# Patient Record
Sex: Female | Born: 1935 | Race: White | Hispanic: No | Smoking: Never smoker
Health system: Southern US, Community
[De-identification: ages and names within clinical notes are randomized; demographics above are authoritative.]

## PROBLEM LIST (undated history)

## (undated) DIAGNOSIS — M1990 Unspecified osteoarthritis, unspecified site: Secondary | ICD-10-CM

## (undated) DIAGNOSIS — G609 Hereditary and idiopathic neuropathy, unspecified: Secondary | ICD-10-CM

## (undated) DIAGNOSIS — I639 Cerebral infarction, unspecified: Secondary | ICD-10-CM

## (undated) DIAGNOSIS — R4189 Other symptoms and signs involving cognitive functions and awareness: Secondary | ICD-10-CM

## (undated) DIAGNOSIS — F0390 Unspecified dementia without behavioral disturbance: Secondary | ICD-10-CM

## (undated) DIAGNOSIS — Z5189 Encounter for other specified aftercare: Secondary | ICD-10-CM

## (undated) DIAGNOSIS — K648 Other hemorrhoids: Secondary | ICD-10-CM

## (undated) DIAGNOSIS — H9110 Presbycusis, unspecified ear: Secondary | ICD-10-CM

## (undated) DIAGNOSIS — I619 Nontraumatic intracerebral hemorrhage, unspecified: Secondary | ICD-10-CM

## (undated) DIAGNOSIS — F32A Depression, unspecified: Secondary | ICD-10-CM

## (undated) DIAGNOSIS — I82409 Acute embolism and thrombosis of unspecified deep veins of unspecified lower extremity: Secondary | ICD-10-CM

## (undated) DIAGNOSIS — Z95828 Presence of other vascular implants and grafts: Secondary | ICD-10-CM

## (undated) DIAGNOSIS — G44009 Cluster headache syndrome, unspecified, not intractable: Secondary | ICD-10-CM

## (undated) DIAGNOSIS — G8929 Other chronic pain: Secondary | ICD-10-CM

## (undated) DIAGNOSIS — E785 Hyperlipidemia, unspecified: Secondary | ICD-10-CM

## (undated) DIAGNOSIS — G629 Polyneuropathy, unspecified: Secondary | ICD-10-CM

## (undated) DIAGNOSIS — I10 Essential (primary) hypertension: Secondary | ICD-10-CM

## (undated) DIAGNOSIS — R51 Headache: Secondary | ICD-10-CM

## (undated) DIAGNOSIS — T7840XA Allergy, unspecified, initial encounter: Secondary | ICD-10-CM

## (undated) DIAGNOSIS — F329 Major depressive disorder, single episode, unspecified: Secondary | ICD-10-CM

## (undated) HISTORY — DX: Essential (primary) hypertension: I10

## (undated) HISTORY — DX: Depression, unspecified: F32.A

## (undated) HISTORY — DX: Encounter for other specified aftercare: Z51.89

## (undated) HISTORY — DX: Presbycusis, unspecified ear: H91.10

## (undated) HISTORY — DX: Hyperlipidemia, unspecified: E78.5

## (undated) HISTORY — DX: Allergy, unspecified, initial encounter: T78.40XA

## (undated) HISTORY — DX: Polyneuropathy, unspecified: G62.9

## (undated) HISTORY — DX: Unspecified osteoarthritis, unspecified site: M19.90

## (undated) HISTORY — DX: Major depressive disorder, single episode, unspecified: F32.9

## (undated) HISTORY — PX: TONSILLECTOMY AND ADENOIDECTOMY: SUR1326

---

## 2006-10-13 DIAGNOSIS — Z95828 Presence of other vascular implants and grafts: Secondary | ICD-10-CM

## 2006-10-13 DIAGNOSIS — I82409 Acute embolism and thrombosis of unspecified deep veins of unspecified lower extremity: Secondary | ICD-10-CM

## 2006-10-13 HISTORY — PX: VENA CAVA FILTER PLACEMENT: SUR1032

## 2006-10-13 HISTORY — DX: Acute embolism and thrombosis of unspecified deep veins of unspecified lower extremity: I82.409

## 2006-10-13 HISTORY — DX: Presence of other vascular implants and grafts: Z95.828

## 2007-08-30 ENCOUNTER — Encounter (INDEPENDENT_AMBULATORY_CARE_PROVIDER_SITE_OTHER): Payer: Self-pay | Admitting: Geriatric Medicine

## 2007-08-30 ENCOUNTER — Observation Stay (HOSPITAL_COMMUNITY): Admission: AD | Admit: 2007-08-30 | Discharge: 2007-09-01 | Payer: Self-pay | Admitting: Internal Medicine

## 2007-08-30 ENCOUNTER — Ambulatory Visit: Payer: Self-pay | Admitting: *Deleted

## 2007-09-13 DIAGNOSIS — I619 Nontraumatic intracerebral hemorrhage, unspecified: Secondary | ICD-10-CM

## 2007-09-13 HISTORY — DX: Other symptoms and signs involving cognitive functions and awareness: I61.9

## 2007-10-03 ENCOUNTER — Inpatient Hospital Stay (HOSPITAL_COMMUNITY): Admission: EM | Admit: 2007-10-03 | Discharge: 2007-10-21 | Payer: Self-pay | Admitting: Emergency Medicine

## 2007-10-27 ENCOUNTER — Encounter: Admission: RE | Admit: 2007-10-27 | Discharge: 2007-10-27 | Payer: Self-pay | Admitting: Geriatric Medicine

## 2007-11-10 ENCOUNTER — Encounter: Admission: RE | Admit: 2007-11-10 | Discharge: 2007-11-10 | Payer: Self-pay | Admitting: Geriatric Medicine

## 2007-11-17 ENCOUNTER — Ambulatory Visit (HOSPITAL_COMMUNITY): Admission: RE | Admit: 2007-11-17 | Discharge: 2007-11-17 | Payer: Self-pay | Admitting: Geriatric Medicine

## 2007-11-22 ENCOUNTER — Encounter: Admission: RE | Admit: 2007-11-22 | Discharge: 2007-11-22 | Payer: Self-pay | Admitting: Geriatric Medicine

## 2008-01-19 ENCOUNTER — Ambulatory Visit (HOSPITAL_COMMUNITY): Admission: RE | Admit: 2008-01-19 | Discharge: 2008-01-19 | Payer: Self-pay | Admitting: Geriatric Medicine

## 2008-03-08 ENCOUNTER — Ambulatory Visit (HOSPITAL_COMMUNITY): Admission: RE | Admit: 2008-03-08 | Discharge: 2008-03-08 | Payer: Self-pay | Admitting: Geriatric Medicine

## 2008-03-08 ENCOUNTER — Ambulatory Visit: Admission: RE | Admit: 2008-03-08 | Discharge: 2008-03-08 | Payer: Self-pay | Admitting: Geriatric Medicine

## 2008-10-23 IMAGING — CR DG ABDOMEN 1V
2 series · 2 of 2 positions shown · non-contrast
Comparison: 10/10/2007

CLINICAL DATA: Abdominal bloating

[t abdomen supine (1 of 2)]
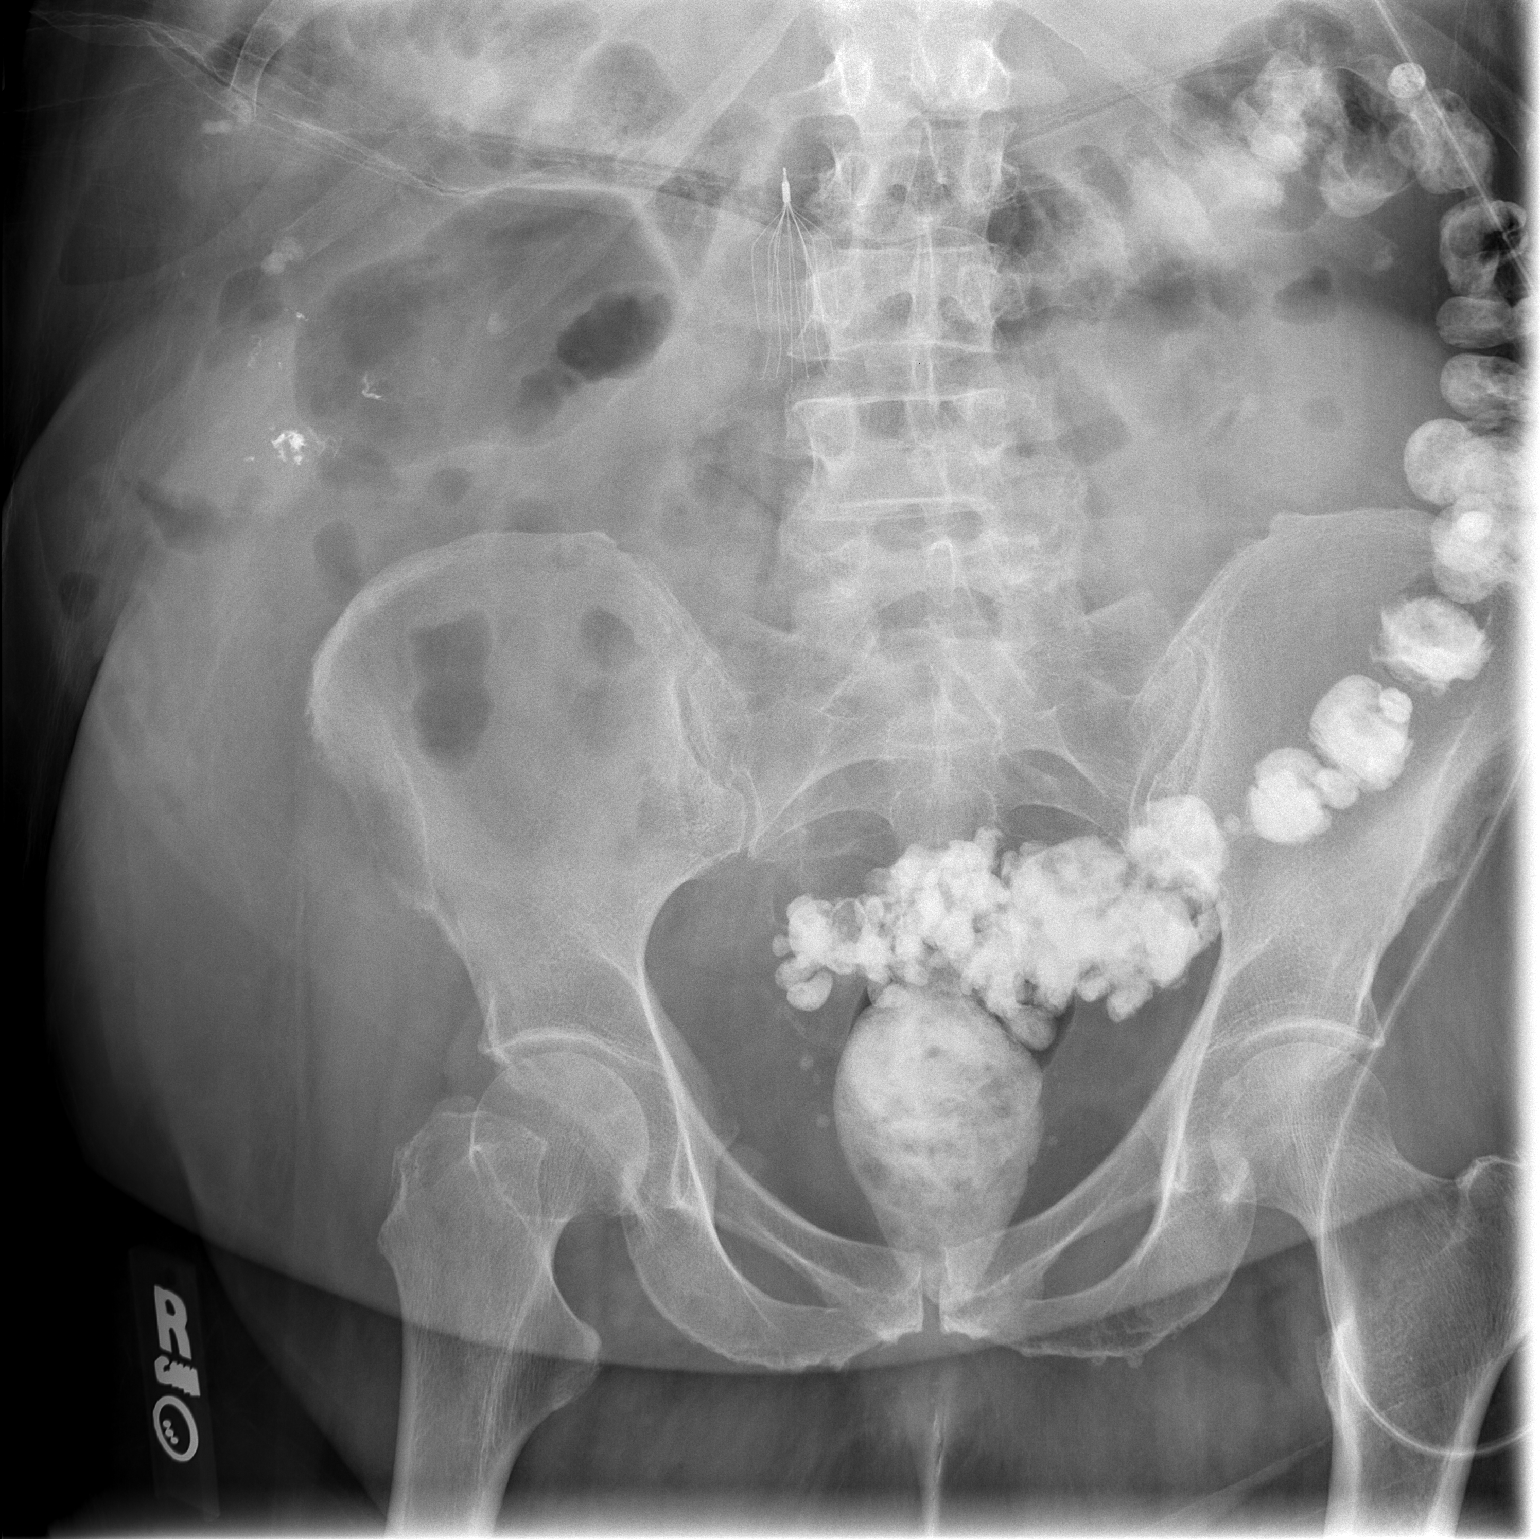

[t abdomen supine (2 of 2)]
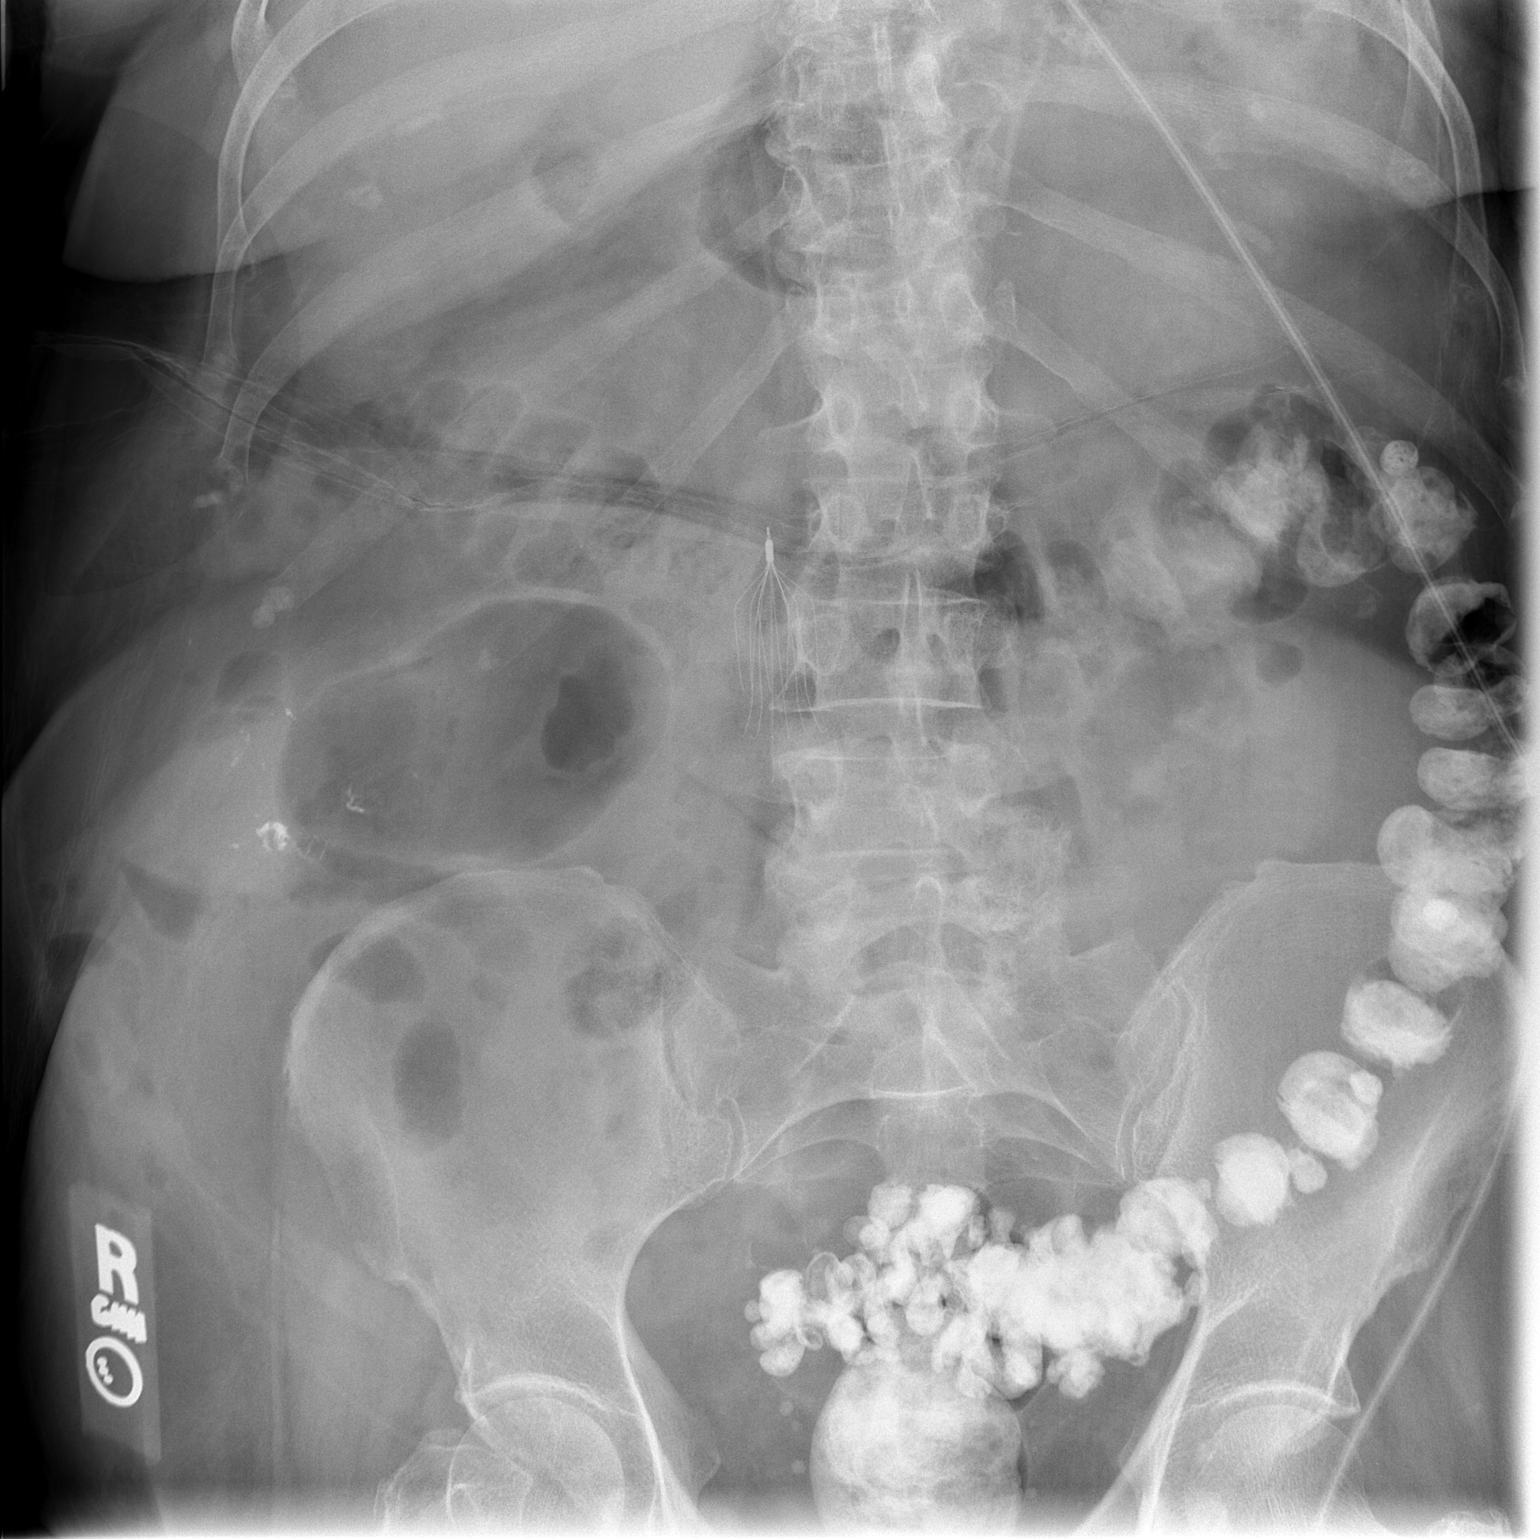

[2 of 2 positions shown; findings below may reference images not displayed]

ABDOMEN - 1 VIEW:

One view supine abdomen shows no gaseous bowel dilation to suggest obstruction.
Bowel loop in the right upper quadrant is probably related to the colon,
possibly representing a high cecum. There is barium in the left colon and rectum
in this patient with a swallowing function study performed 3 days ago. IVC
filter is noted. Visualized bony structures are unremarkable.
IMPRESSION: No evidence for bowel obstruction or ileus.

## 2008-10-25 IMAGING — CR DG CHEST 2V
1 series · 1 of 1 positions shown · non-contrast
Comparison: Portable study 10/14/07.

CLINICAL DATA: Subarachnoid hemorrhage.  Dyspnea and cough. 
 CHEST ? 2 VIEW:

[w chest lat]
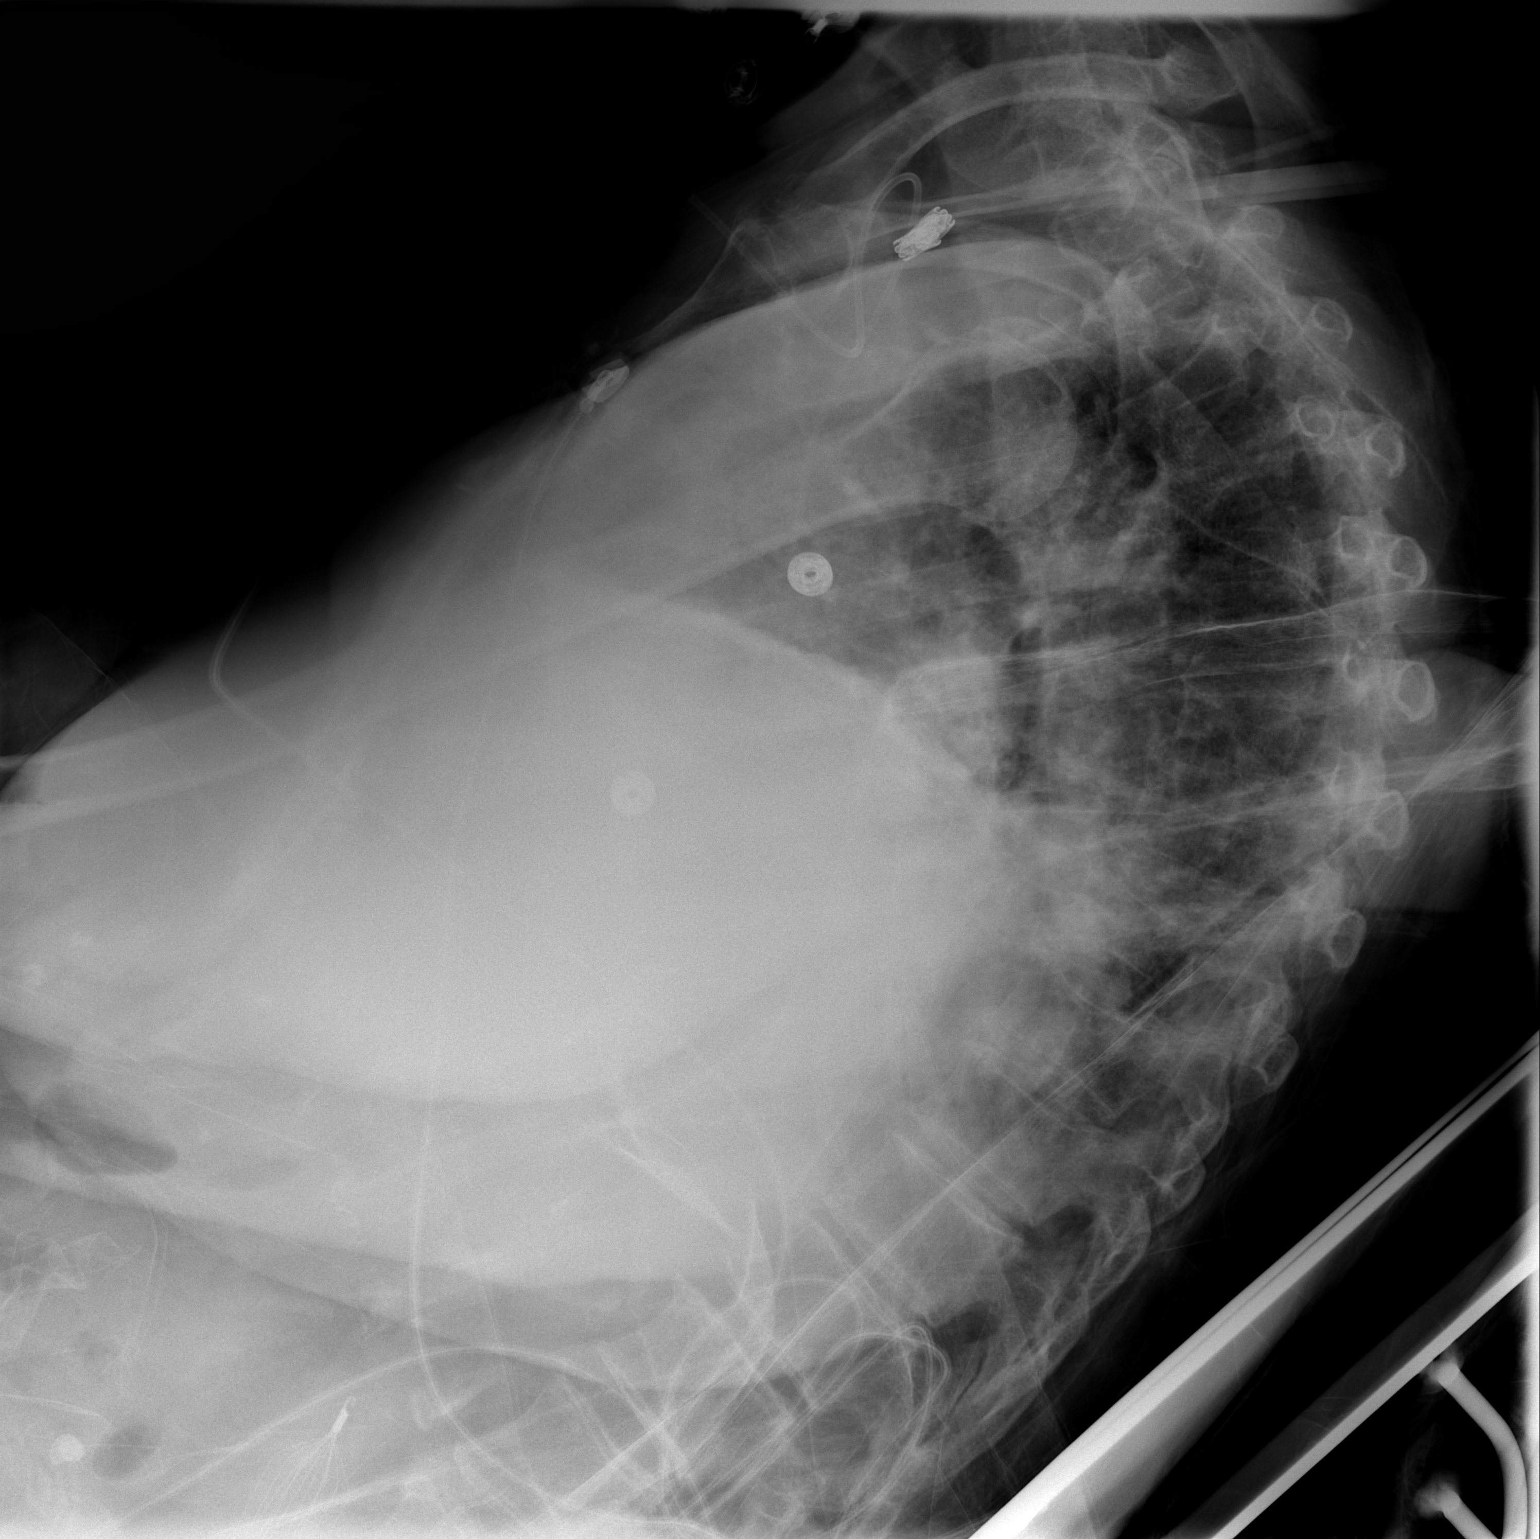

[1 of 1 positions shown; findings below may reference images not displayed]

FINDINGS: Right arm PICC is unchanged in the SVC.  The heart size and mediastinal contours are stable.  Overall pulmonary aeration has improved with mild residual bibasilar airspace disease, probably reflecting atelectasis.  There may be a small amount of pleural fluid bilaterally.  There is no evidence of pneumothorax or edema.
IMPRESSION: 1.  Stable position of right arm PICC.
 2.  Interval improvement in bibasilar aeration.

## 2008-10-26 IMAGING — CT CT HEAD W/O CM
1 series · 15 of 30 positions shown, 19 images · IV contrast (agent unspecified)
Comparison: 10/10/07.

CLINICAL DATA: Subarachnoid hemorrhage.  
HEAD CT WITHOUT CONTRAST:
TECHNIQUE: Contiguous axial images were obtained from the base of the skull through the vertex according to standard protocol without contrast.

[Series 2: brain · axial · 0.47mm/px · z∈[+175,+308]mm · 15 of 40 slices shown, 19 images]
[im 2/40  brain]
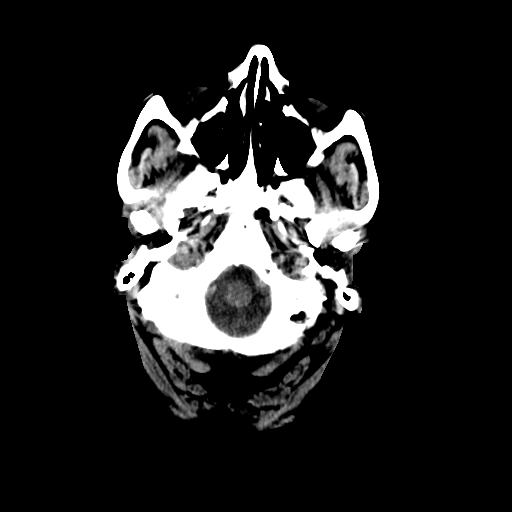
[im 2/40  bone]
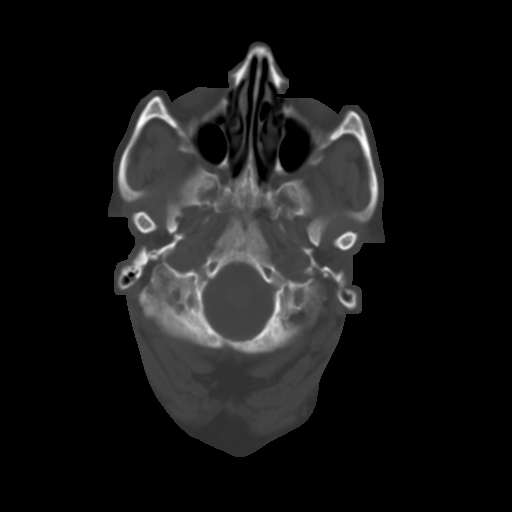
[im 5/40  brain]
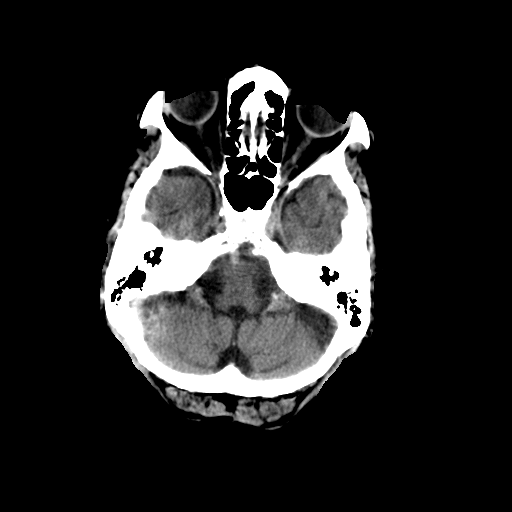
[im 7/40  brain]
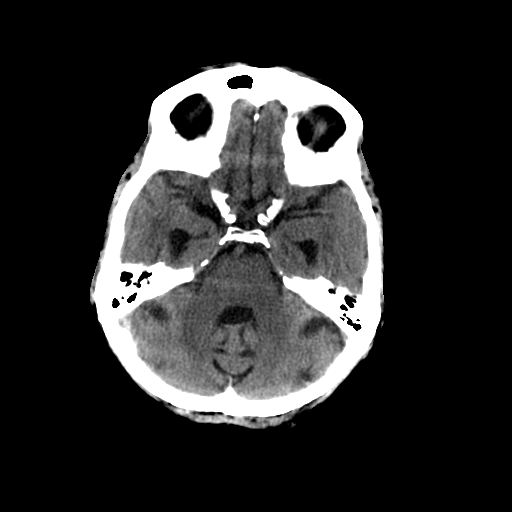
[im 10/40  brain]
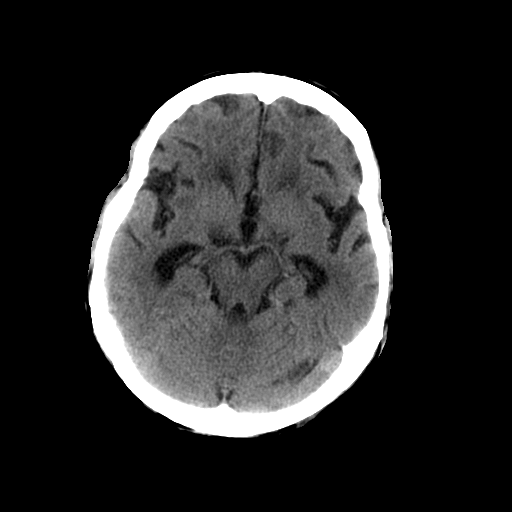
[im 13/40  brain]
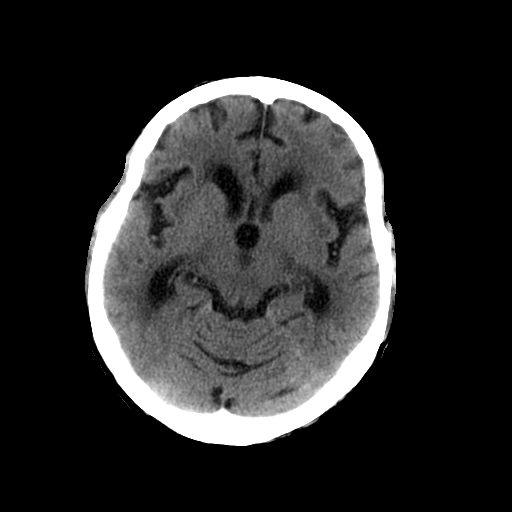
[im 13/40  bone]
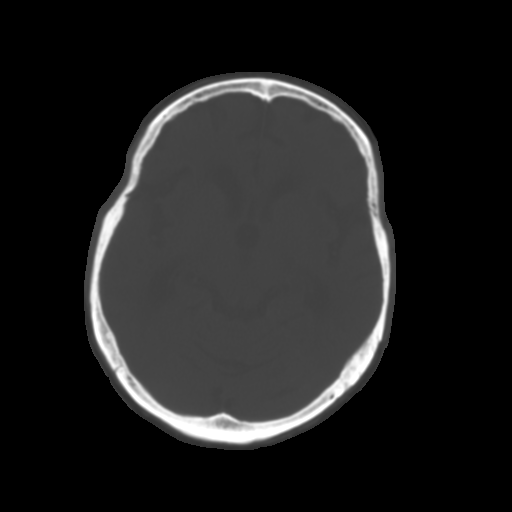
[im 15/40  brain]
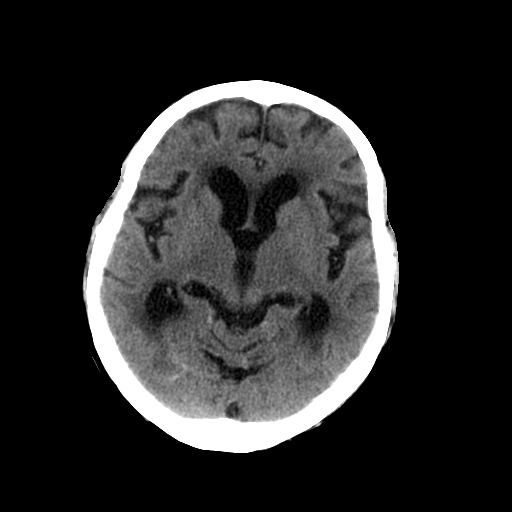
[im 18/40  brain]
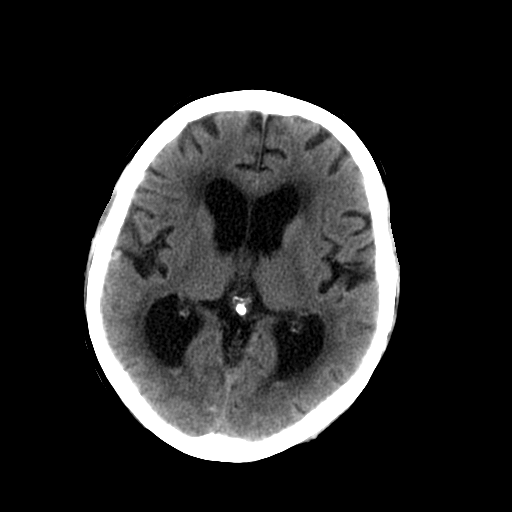
[im 21/40  brain]
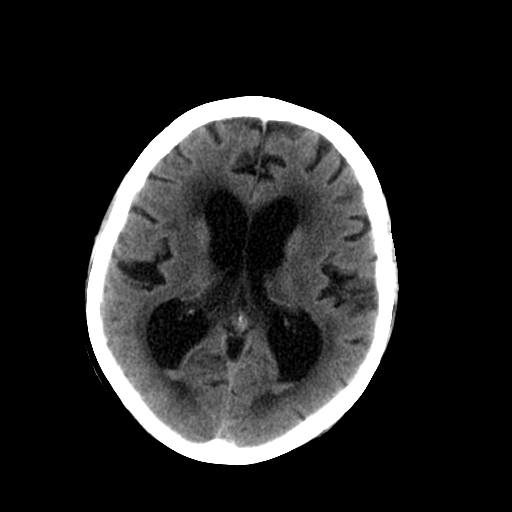
[im 22/40  brain]
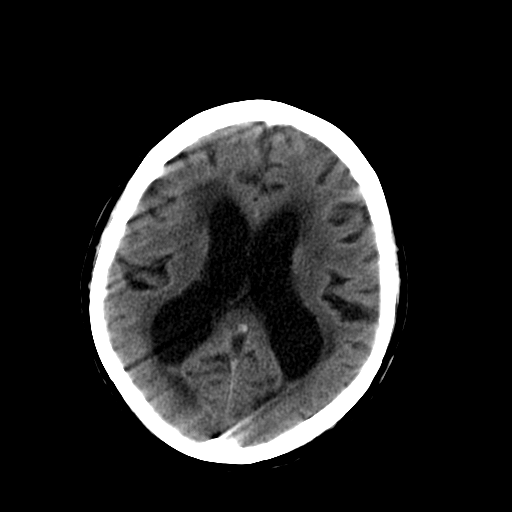
[im 22/40  bone]
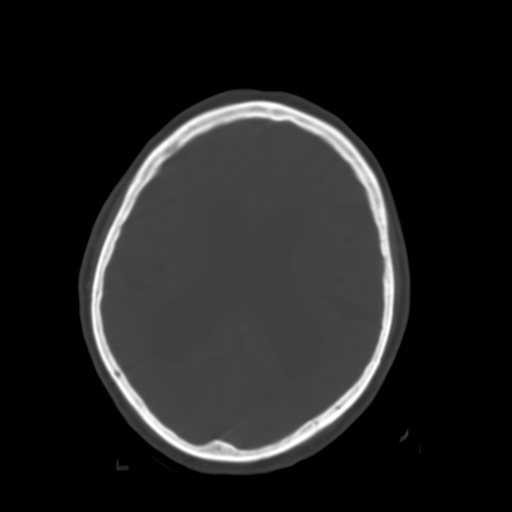
[im 25/40  brain]
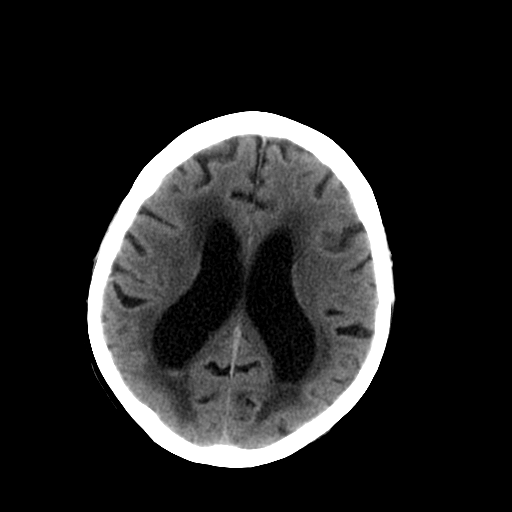
[im 27/40  brain]
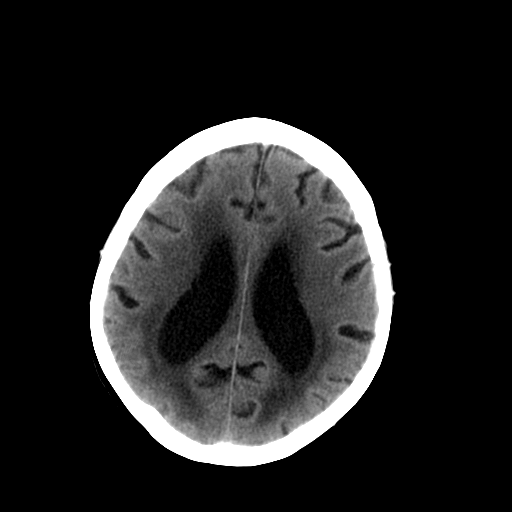
[im 30/40  brain]
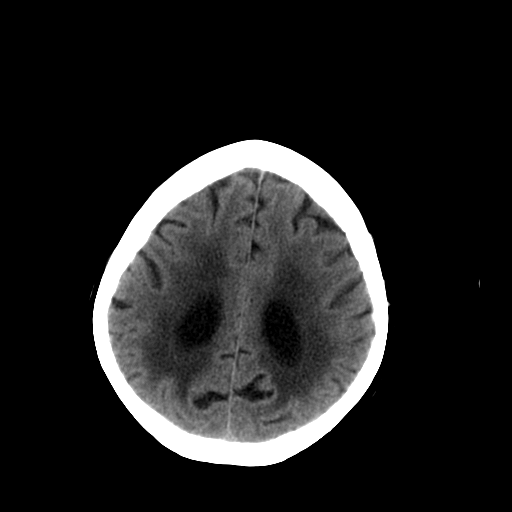
[im 33/40  brain]
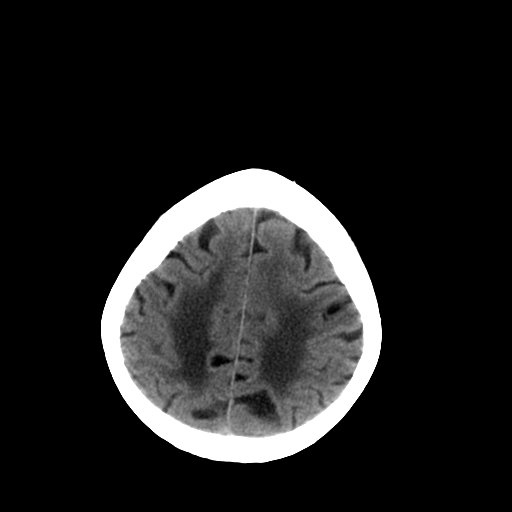
[im 33/40  bone]
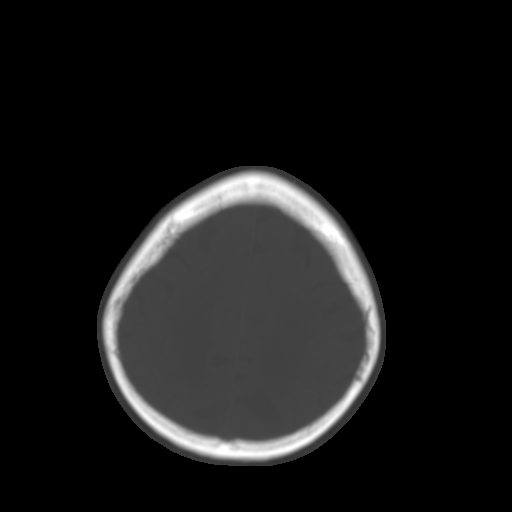
[im 35/40  brain]
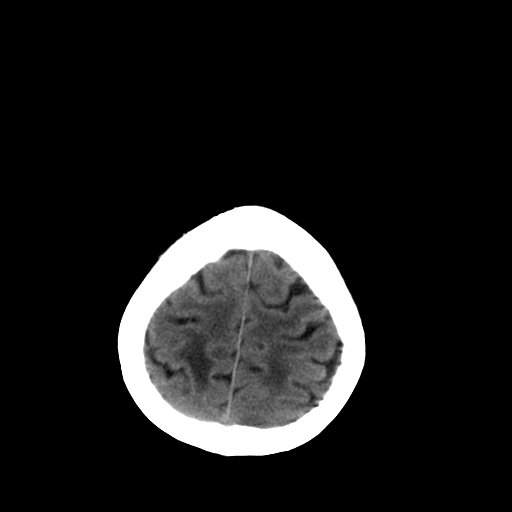
[im 38/40  brain]
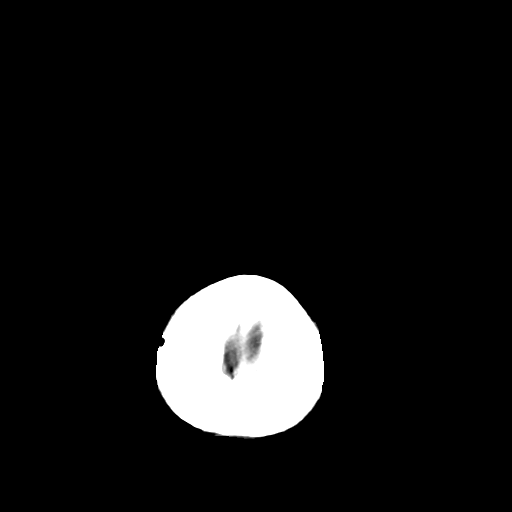

[15 of 30 positions shown; findings below may reference images not displayed]

Findings;   There is motion on the CT scan and some of the images were repeated. 
There is resolving intraventricular hemorrhage with only a small amount of residual intraventricular hemorrhage noted on today?s study.  There is a small amount of residual subarachnoid hemorrhage in the right temporal lobes and in the right occipital lobe.  Hemorrhage anterior to the pons and medulla has resolved.  No new hemorrhage is present.  There is no subdural hematoma.  
The ventricles remain enlarged, but stable.  This is likely due to prominent atrophy as there is commensurate enlargement of the subarachnoid space.  There could be a mild element of communicating hydrocephalus.  There is no midline shift.  There is extensive chronic ischemia in the white matter which is unchanged.
IMPRESSION: 1.  Resolving intraventricular and subarachnoid hemorrhage.  No new hemorrhage is identified.  
2.  Ventricular prominence is stable from the prior study.  There is diffuse chronic microvascular change in the white matter.

## 2010-11-03 ENCOUNTER — Encounter: Payer: Self-pay | Admitting: Geriatric Medicine

## 2011-02-25 NOTE — H&P (Signed)
NAMEAVENLY, ROBERGE                ACCOUNT NO.:  [id number]   MEDICAL RECORD NO.:  [id number]          PATIENT TYPE:  INP   LOCATION:  5729                         FACILITY:  [facility]   PHYSICIAN:  Michiel Cowboy, MDDATE OF BIRTH:  August 02, 1921   DATE OF ADMISSION:  08/30/2007  DATE OF DISCHARGE:                              HISTORY & PHYSICAL   ATTENDING:  Michiel Cowboy, MD   PRIMARY CARE Jaidy Cottam:  Dr. Pete Glatter.   CHIEF COMPLAINT:  Left lower extremity swelling.   HISTORY OF PRESENT ILLNESS:  This is an 77 year old female with history  of peripheral venous insufficiency and peripheral neuropathy who  developed left lower extremity edema for the past 2 days.  Dr. Pete Glatter  had sent her down to get ultrasound of her lower extremity.  Ultrasound  showed left DVT extending to femoral vein.  The patient was called in to  [facility] for admission.   The patient otherwise feels fairly well.  She reports she lives a fairly  sedentary lifestyle and does not get up much off the couch.  She has had  a colonoscopy in the past 10 years that showed polyps but cannot  remember exactly when that was done.   FAMILY HISTORY:  Noncontributory.   SOCIAL HISTORY:  No tobacco, no alcohol. Son is at bedside.  The patient  currently retired.   PAST MEDICAL HISTORY:  Significant for urinary incontinence and  peripheral neuropathy, unclear etiology.   ALLERGIES:  NO KNOWN DRUG ALLERGIES.   MEDICATIONS:  Lyrica, patient unsure of dose 3 times a day,  multivitamin, vitamin C, ICaps, calcium, vitamin D.   PHYSICAL EXAMINATION:  VITAL SIGNS:  Blood pressure 142/70, respirations  18, pulse 59, oxygen saturation 91% on room air.  Temperature 98.0.  GENERAL:  The patient in no acute distress, obese female.  HEENT:  Nontraumatic.  Moist mucous membranes.  HEART:  Regular rate and rhythm.  No murmurs, rubs or gallops.  LUNGS:  Clear to auscultation bilaterally.  ABDOMEN:  Obese, but  nontender.  Nondistended.  Extremities: Lower extremities about 1+ edema on the left and no edema  on the right.  Lower extremity left, definitely appears very swollen and  enlarge in diameter.   STUDIES:  Doppler of the lower extremities showed DVT on the left side  extending to femoral vein.   ASSESSMENT/PLAN:  This is an 77 year old female who is now new deep vein  thrombosis.  1. Deep vein thrombosis.  We will obtain chemistry, CBC and initial      coags.  We will start the patient on Lovenox as per pharmacy.  We      will start the patient on Coumadin tomorrow.  Given the patient is      slightly hypoxic on room air, we will obtain CT of chest once      results of chemistry panel is back and we know what her creatinine      is.  We will do Lovenox teaching.  2. Urinary incontinence.  We will ask the patient to bring in her home  medications because she is unsure what she is taking for this, if      anything.  Concerning hypercoagulable workup we will defer this to      primary care physician.  I discussed with Dr. Brooke Dare.  3. Peripheral neuropathy.  We will obtain hemoglobin A1c and TSH in      the morning.  4. Prophylaxis.  The patient will be on Lovenox.  Will do Protonix.   DISPOSITION:  Anticipate discharging in the next few days if the patient  is stable.  PT, OT to assess for the patient's need.      Michiel Cowboy, MD  Electronically Signed     AVD/MEDQ  D:  08/30/2007  T:  08/30/2007  Job:  [id number]   cc:   Hal T. Stoneking, M.D.

## 2011-02-25 NOTE — Discharge Summary (Signed)
Alice Wu, Alice Wu                ACCOUNT NO.:  [id number]   MEDICAL RECORD NO.:  [id number]          PATIENT TYPE:  OBV   LOCATION:  4733                         FACILITY:  [facility]   PHYSICIAN:  Michiel Cowboy, MDDATE OF BIRTH:  10/04/1921   DATE OF ADMISSION:  08/30/2007  DATE OF DISCHARGE:  09/01/2007                               DISCHARGE SUMMARY   Ms. [name] is an 77 year old female with a history of peripheral  neuropathy, urinary incontinence, and peripheral venous insufficiency  who presented with lower extremity edema for 2 days.  She came to see  her primary care Shyane Fossum who obtained an ultrasound and it showed lower  extremity DVT.   HOSPITAL COURSE:  The patient admitted by [facility], started on  Lovenox and Coumadin for DVT treatment, was discharged home on Lovenox  shots and Coumadin, and to have careful follow up with Dr. Pete Glatter for  INR.   DISCHARGE DIAGNOSES:  1. Deep venous thrombosis.  2. Chronic peripheral neuropathy.  3. Chronic urinary incontinence.   The patient was discharged on September 01, 2007.   The patient received education on how to administer shots.  The patient  was discharged back to [place] with home heath help for helping with  administration of Lovenox.  The patient to follow up with her primary  care Kynesha Guerin, Dr. Pete Glatter.  The patient to have her INR checked on  the day of the discharge.   DISCHARGE MEDICATIONS:  1. She is to also continue Lovenox 90 mg subcu q.12 h for 5 days.  2. Coumadin 5 mg p.o. daily.  3. Lyrica 100 mg 3 times a day.  4. Multivitamins.  5. ICaps daily.  6. Calcium plus vitamin D daily.  7. TriCor 145 mg p.o. daily.  8. Potassium 10 mEq p.o. b.i.d.  9. VESIcare 5 mg p.o. daily.      Michiel Cowboy, MD  Electronically Signed    AVD/MEDQ  D:  01/25/2008  T:  01/25/2008  Job:  [id number]   cc:   Hal T. Stoneking, M.D.

## 2011-02-25 NOTE — Discharge Summary (Signed)
NAMEGLENIS, MUSOLF                ACCOUNT NO.:  [id number]   MEDICAL RECORD NO.:  [id number]          PATIENT TYPE:  INP   LOCATION:  3004                         FACILITY:  [facility]   PHYSICIAN:  Michiel Cowboy, MDDATE OF BIRTH:  1921/01/01   DATE OF ADMISSION:  10/03/2007  DATE OF DISCHARGE:                               DISCHARGE SUMMARY   INTERIM DISCHARGE SUMMARY:   DATE OF DISCHARGE:  To be determined.   STUDIES:  1. CT of the head done on October 03, 2007 showing subdural and      epidural blood tracking down from posterior fossa all the way down      to C4-5 disk space level, now compromising mild mass effect on the      cervical cord.  No acute bony findings.  High-density change in      cervical spondylosis.  Also subarachnoid, interventricular, and      subdural blood. likely spontaneous.  No evidence of aneurysm.      Ventricular enlargement secondary to interventricular blood.      Periventricular white matter disease.  No significant bony      findings.  2. MRI of the brain done on October 04, 2007 showing subdural      hematoma on the clivus with extension along the left temporal bone.      Maximum thickness of 16 mm along clivus, exerting some mass effect      upon the pons.  Subarachnoid blood particularly along the convexity      of the right plus/minus contusion to the right temporal lobe.      Interventricular blood.  No acute stroke.  3. X-ray of the abdomen showing limited motion portable exam and      nonspecific bowel gas pattern.  Gas-filled non-dilated loops of      bowel noted centrally.  4. KUB from October 04, 2007 status post IVC filter.  5. Another KUB done on October 10, 2007 showing no evidence of      obstruction,  6. Chest x-ray done on October 14, 2007 showing right-sided PICC line      tip.  No pneumothorax identified. elevated right hemidiaphragm.  7. Chest x-ray from October 11, 2007 showing right middle lobe and      right upper  lobe opacification which may represent infiltrate or      atelectasis, mild subsegmental changes, left base.  Cardiomegaly.  8. Repeat CT scan of the head without contrast on October 10, 2007      showing stable examination.  No change in appearance.  9. Swallowing study done on October 13, 2007 showing some dysphagia.  10.Abdominal KUB done on October 16, 2007 showing no evidence of bowel      obstruction or ileus.  11.Echocardiogram done on October 12, 2007 showing EF of about 60%      but poor study.  Mild-to-moderate mitral annular calcifications.   CONSULTATIONS:  1. Rosine Abe, M.D. of [facility].  2. Neurosurgery.   HOSPITAL COURSE:  The patient is a very pleasant 77 year old female with  past medical  history significant for DVT diagnosed in November of 2008  as well as peripheral neuropathy and diabetes.  The patient presented to  the emergency department on October 03, 2007 with severe headache and  supratherapeutic INR.  CT scan of her head showed extensive subarachnoid  hemorrhage extending down to her spinal canal.  Neurosurgery was  consulted and felt that she is not an operative candidate.  Her  anticoagulation was stopped, and she underwent IVC filter implant on  October 04, 2007.  Her hospital course was complicated by an episode of  rapid atrial fibrillation requiring a transient Cardizem drip for which  a cardiology consult was called.  She was also loaded on digoxin.  Since  then, the patient converted back to sinus.  During an episode of atrial  fibrillation with rapid ventricular response, she had a mild troponin  elevation up to 0.19 which was felt to be secondary to rapid atrial  fibrillation.  The patient remained hemodynamically stable.  Secondary  to her neurological status and confusion, family requested placement in  a nursing facility.   Concerning her other problems, the patient had a questionable pneumonia  thought to be possibly  secondary to aspiration for which she was started  on Zosyn.  A PICC line was placed on October 14, 2007.  Patient expected  to complete about 7 to 10-day course of antibiotics.  The patient was  also noted to have urinary tract infection on October 11, 2007 which  grew Klebsiella which is of note sensitive to ciprofloxacin and  levofloxacin but resistant to ampicillin.   Regarding nutritional status, the patient, secondary to confusion, has  been having difficulties with her nutrition but this may gradually  improve as her mental status has been improving.   At the time of this dictation, her current medications are:  1. Diltiazem 240 mg p.o. daily.  2. Zosyn 0.375 grams IV q.6h.  3. Labetalol 10 mg IV q.4h. p.r.n.  4. Senna 1 tablet p.o. q.h.s.  5. Tramadol 50 mg p.o. q.6h.   DIET:  The patient is on a dysphagia 3 diet.   ACTIVITY:  Followed by physical therapy and occupational therapy.   Please see final addendum for discharge medications and events after  this dictation.      Michiel Cowboy, MD  Electronically Signed     AVD/MEDQ  D:  10/17/2007  T:  10/17/2007  Job:  [id number]   cc:   Hal T. Stoneking, M.D.  Elmore Guise., M.D.

## 2011-02-25 NOTE — Discharge Summary (Signed)
NAMELORISA, Wu                ACCOUNT NO.:  [id number]   MEDICAL RECORD NO.:  [id number]          PATIENT TYPE:  INP   LOCATION:  3004                         FACILITY:  [facility]   PHYSICIAN:  Corinna L. Lendell Caprice, MDDATE OF BIRTH:  03/16/1921   DATE OF ADMISSION:  10/03/2007  DATE OF DISCHARGE:  10/21/2007                               DISCHARGE SUMMARY   ADDENDUM  This is an addendum to the previously dictated discharge summary by Dr.  Adela Glimpse on October 17, 2007.   Since October 17, 2007 patient had a repeat CT of the brain on October 19, 2007 which showed resolving intraventricular and subarachnoid  hemorrhage.  No new hemorrhage identified.  Diffuse chronic  microvascular change.  Two views of the chest done on October 18, 2007  showed interval improvement in basilar aeration.  She had a repeat UA  which showed negative nitrite, negative leukocyte esterase, negative  blood and urine culture is negative.  Her electrolytes and CBC remained  stable.  Her prealbumin was 21.  She had a calorie count, and was felt  by Dietary to be taking in adequate nutrition.  They have recommended  Ensure pudding t.i.d.   Please note that she is to be on dysphagia 3 diet with honey thickened  liquids, not thin liquids as previously dictated.  Her Zosyn and has  been stopped, and she has been switched to Augmentin.  Her vital signs  have been stable.  Her mental status has improved.  She is alert and  conversant.  She still has difficulty with the name of the hospital, but  does know that she is in the hospital and knows the year.  She remains  weak, and will need physical therapy and occupational therapy.  She will  also need a repeat modified barium swallow in a weeks time to see  whether she may advance her diet.   FINAL MEDICATION RECOMMENDATIONS.:  Stop the Zosyn.  Augmentin 875 mg  p.o. b.i.d. until October 23, 2007 and then stop Tramadol.  Instead,  Tylenol 650 mg p.o. q. 4 hours p.r.n.  pain.  Stop Labetalol.   CONDITION ON DISCHARGE:  Condition is stable.   FOLLOW UP:  Follow-up with primary care physician within a week.  Continue physical therapy, occupational therapy.  Encourage p.o. intake,  and observe aspiration precautions.      Corinna L. Lendell Caprice, MD  Electronically Signed     CLS/MEDQ  D:  10/21/2007  T:  10/21/2007  Job:  [id number]   cc:   Hal T. Stoneking, M.D.

## 2011-02-25 NOTE — H&P (Signed)
Alice Wu, JASKO                ACCOUNT NO.:  [id number]   MEDICAL RECORD NO.:  [id number]          PATIENT TYPE:  INP   LOCATION:  3314                         FACILITY:  [facility]   PHYSICIAN:  Therisa Doyne, MD    DATE OF BIRTH:  [birth year]   DATE OF ADMISSION:  10/03/2007  DATE OF DISCHARGE:                              HISTORY & PHYSICAL   PRIMARY CARE Darroll Bredeson:  Dr. Merlene Laughter.   CHIEF COMPLAINT:  Neck pain and headache.   HISTORY OF PRESENT ILLNESS:  An 77 year old white female with a past  medical history significant for deep venous thrombosis in November 2008,  on chronic anticoagulation, who presents to the emergency department  with  a severe headache.  The patient reports difficulty with her  Coumadin and states that she has been supratherapeutic for approximately  the past 1 week.  When she had her INR last checked, it was  approximately 14.  She was told to hold her Coumadin.  However, over the  past 4 days, she developed sudden onset of headache, located in the  posterior neck.  It has been gradually worsening.  Tylenol and other  over-the-counter medications have not relieved the pain.  She denies any  focal neurological deficits and denies dysarthria or abnormal mentation.  She comes to the emergency department for further evaluation and was  found to have a subarachnoid hemorrhage, interventricular hemorrhage,  and subdural hemorrhage.   REVIEW OF SYSTEMS:  All systems reviewed and negative except as  mentioned above in the history of present illness.   PAST MEDICAL HISTORY:  1. Deep vein thrombosis in November 2008, on chronic anticoagulation.  2. Peripheral neuropathy.   SOCIAL HISTORY:  [facility]  community.  She is functional in her activities of daily living;  however, she does have help from an aide in the morning 5 days per week.   FAMILY HISTORY:  There is no family history of bleeding disorders, but  there is a family history of diabetes.   MEDICATIONS:  1. Coumadin which has currently been held since Wednesday.  2. Lyrica.  The patient is unsure of dose but believes it might be 25      mg t.i.d.  3. Vitamin B12.  4. Multivitamin, calcium and vitamin D.   ALLERGIES:  No known drug allergies.   PHYSICAL EXAM:  Temperature 97.2, blood pressure 163/74, pulse 68,  respirations 18, oxygen saturation 96% on room air.  GENERAL:  No acute distress, alert and oriented x3.  HEENT:  Normocephalic, atraumatic.  Pupils are equally round and  reactive to light and accommodation.  They are reactive from 3 mm to 2  mm.  NECK:  No jugular venous distention, no masses, no carotid bruits.  CARDIOVASCULAR:  Regular rate and rhythm.  No murmurs, rubs, or gallops.  CHEST:  Clear to auscultation bilaterally.  ABDOMEN:  Soft, nontender, nondistended.  EXTREMITIES:  No cyanosis, clubbing, or edema.  SKIN:  No rashes.  NEUROLOGIC:  Alert and oriented x3.  Cranial nerves 2-12 are grossly  intact  with no focal deficits.  MUSCULOSKELETAL:  Intact bilateral upper and lower extremities.   LABORATORY VALUES:  CBC within normal limits.  Her pro time is 42.6, and  her INR is 4.3.  CT scan showed subarachnoid hemorrhage, a ventricular  hemorrhage, and subdural hemorrhage with mild hydrocephalus.  Her areas  of hemorrhage do extend down the spinal canal to the level of C5.  No C-  spine fracture is noted.   ASSESSMENT AND PLAN:  An 77 year old white female with a past medical  history significant for a deep vein thrombosis in November 2008, on  chronic anticoagulation who presents today with a supratherapeutic INR  and headache, who was found to have a subarachnoid hemorrhage, subdural  hemorrhage, and a ventricular hemorrhage by CT scan.   1. We will admit the patient to [facility].   1. Subarachnoid/subdural hemorrhage and intraventricular hemorrhage.      Neurosurgery  was consulted in the emergency department.  Dr. Trey Sailors will be seeing the patient.  He has recommended an MRI and MRA.      He will review these films once the study has been obtained.  In      the interim, we will reverse the patient's anticoagulation status      and try to bring her INR down to the therapeutic range.  We will      give her 5 mg of vitamin K IV as well as 2 units of fresh frozen      plasma.  We will check an INR after this has been done.  We will      monitor the patient in the step-down unit with a q.2h. neuro checks      and p.r.n. hydralazine as needed for systolic blood pressures      greater than 160.   1. Recent deep vein thrombosis.  The patient is currently      supratherapeutic on her anticoagulation.  Because of her      subarachnoid hemorrhage and subdural hemorrhage, we will be      reversing the patient's anticoagulation with vitamin K and fresh      frozen plasma.  I have counseled the patient on the risks involved      with reversing her anticoagulation status in the setting of a      recent deep vein thrombosis; however, the benefits outweigh the      risks, and they are in agreement with doing this.   1. FEN: normal saline at 75 mL/hr.  Electrolytes are stable.  Regular      diet.   1. Prophylaxis for deep venous thrombosis prophylaxis, we will place      the patient on pneumatic compression hose.      Therisa Doyne, MD  Electronically Signed     SJT/MEDQ  D:  10/03/2007  T:  10/04/2007  Job:  [id number]

## 2011-02-25 NOTE — Consult Note (Signed)
Alice Wu, Alice Wu NO.:  [id number]   MEDICAL RECORD NO.:  [id number]           PATIENT TYPE:   LOCATION:                                 FACILITY:   PHYSICIAN:  Elmore Guise., M.D.DATE OF BIRTH:  Feb 05, 1921   DATE OF CONSULTATION:  10/09/2007  DATE OF DISCHARGE:                                 CONSULTATION   INDICATION:  Rapid atrial fibrillation.   PRIMARY CARE PHYSICIAN:  Dr. Pete Glatter.   Family member requests [facility] for consult.   HISTORY OF PRESENT ILLNESS:  Patient is an 77 year old white female,  with past medical history of DVT (diagnosed in November 2008),  peripheral neuropathy, who was admitted with supratherapeutic INR and  subarachnoid hemorrhage.  Patient has been treated conservatively with  serial CT scans.  She underwent IVC filter placement after reversal of  her Coumadin.  She did tolerate her procedures well.  She has had off  and on continued headaches since admission, and last night had an  episode of rapid atrial fibrillation requiring Cardizem drip.  She also  received a digoxin load.  She is now in normal sinus rhythm.  After  taking oxycodone yesterday, family member reports that she has been  lethargic, however, awakens and responds nicely, and then falls quickly  back asleep.  She moves all extremities.  She denies any prior cardiac  issues.  Her blood pressure and heart rate are now stable with blood  pressure 140/70 and heart rate 70 per minute, showing normal sinus  rhythm.  She is on a Cardizem drip at 1 mg/hour.  History was obtained  from patient, as well as from her family member, although the review of  systems are as per HPI or otherwise.   CURRENT MEDICATIONS:  Digoxin load.  She received digoxin 0.5 mg x1  dose, then 0.25 mg q.6 hours x2 doses.  She is currently on a diltiazem  drip at 1 mg/hour.  She also has Tylenol and Ultram as needed.   ALLERGIES:  None.   FAMILY HISTORY:  Positive for  diabetes.   SOCIAL HISTORY:  She is in independent living center with an aide 5 days  per week.  No tobacco or alcohol.  She typically is ambulatory without  problems.   PHYSICAL EXAMINATION:  She is afebrile.  Blood pressure is 148/66, heart  rate is 58 to 70, showing normal sinus rhythm.  Sating 96% on 2 L nasal  cannula.  She answers questions appropriately, and moves all  extremities, however, quickly falls back to sleep.  She has no JVD and no bruits.  LUNGS:  Clear.  HEART:  Regular with normal S1, S2.  ABDOMEN:  Soft, nontender, nondistended.  No rebound or guarding.  EXTREMITIES:  Warm with no significant edema.   Her blood work done today show a CPK of 141, 198 and 145; with MB of  1.7, 1.6 and 1.9; troponin I of 0.19, 0.2, and 0.25.  Her last BMP was  done December 24, and showed a potassium level of 3.8, a BUN and  creatinine of  18 and 1.  At that time, she also had a CBC showing a  white count of 6.9, hemoglobin of 12.5, platelet count of 322.  Her ECG  recently done shows sinus bradycardia with short PR interval with 110  ms.  Her rate is 57 per minute with nonspecific T-wave changes.  Her  prior tracing showed atrial fibrillation, rate of 141 per minute with  nonspecific ST-T wave changes.   IMPRESSION:  1. Subarachnoid bleed.  2. Episode of rapid atrial fibrillation, now in normal sinus rhythm.  3. History of recent deep venous thrombosis, status post inferior vena      cava filter placement.  4. Mild troponin elevation, likely secondary to her rapid atrial      fibrillation.   PLAN:  At this time, patient is currently hemodynamically stable and  back in normal sinus rhythm.  She is rate-controlled.  I would recommend  discontinuing her Cardizem drip and starting p.o. Cardizem 60 mg p.o.  q.12 hours.  We will check an echo on Monday.  She is not a candidate  for anticoagulation because of her recent subarachnoid bleed.  Would  hold also aspirin at this time.  She  does appear weak and frail,  however, family members report that she is more responsive currently.  Likely will need PT and OT to help with strengthening.  This will be  left up the primary team.  I will follow her over the weekend until Dr.  Katrinka Blazing or Dr. Amil Amen return on Monday.      Elmore Guise., M.D.  Electronically Signed     TWK/MEDQ  D:  10/09/2007  T:  10/09/2007  Job:  [id number]

## 2011-02-25 NOTE — Consult Note (Signed)
NAMELASHONNE, SHULL NO.:  [id number]   MEDICAL RECORD NO.:  [id number]          PATIENT TYPE:  INP   LOCATION:  3314                         FACILITY:  [facility]   PHYSICIAN:  Payton Doughty, M.D.      DATE OF BIRTH:  1921/03/31   DATE OF CONSULTATION:  10/03/2007  DATE OF DISCHARGE:                                 CONSULTATION   REQUESTING PHYSICIAN:  Emergency room doctor.   CONSULTING PHYSICIAN:  Payton Doughty, M.D.   CONSULT LOCATION:  Emergency room.   I was called to see this 77 year old, right handed, white lady who has  had a several week history of nausea and vomiting with increasing neck  pain.  She ended up with a CT in the emergency room that showed a  subdural hematoma that goes basically around the clivus and down the  upper cervical spine.  She also has a little bit of interventricular  blood and some subarachnoid hemorrhage.  Historically, the patient has  DVTs and is on Coumadin.  Her INR is now 4.  There is a report that last  week it was 11.  She also has hypertension.   PHYSICAL EXAMINATION:  GENERAL:  The particulars are left to the  admitting medicine service.  NEUROLOGIC:  She awakens and follows commands.  She is somewhat sore on  her neck.  She is oriented x3.  Her pupils are equal, round, reactive to  light.  Extraocular movements are intact.  Facial movement and sensation  are intact.  Tongue protrudes in the midline.  Shoulder shrug is normal.  She describes no swallowing difficulties.  Motor exam shows 5/5 strength  throughout the upper and lower extremities.  No current sensory deficit.  Reflexes are intact.  She does have upgoing toes bilaterally.   CT in addition to the subdural blood, there is subarachnoid and  interventricular blood as noted above.   CLINICAL IMPRESSION:  Likely subdural hematoma from over zealous  Coumadin.  She certainly does not have any compressive pathology.  I do  not think she needs an operation.  We will  check an MRA but I doubt if  she has an aneurysmal source for this blood.   She is going to be admitted to the medical service and I will follow her  and we will check on the MRA.           ______________________________  Payton Doughty, M.D.     MWR/MEDQ  D:  10/04/2007  T:  10/04/2007  Job:  [id number]

## 2011-07-03 LAB — COMPREHENSIVE METABOLIC PANEL
ALT: 66 — ABNORMAL HIGH
AST: 37
Albumin: 2.8 — ABNORMAL LOW
Alkaline Phosphatase: 34 — ABNORMAL LOW
BUN: 16
Chloride: 107
Potassium: 3.9
Total Bilirubin: 1.6 — ABNORMAL HIGH

## 2011-07-03 LAB — CBC
HCT: 37
HCT: 37.8
HCT: 39.6
MCHC: 33.6
MCHC: 34.3
MCV: 91.5
MCV: 92.8
Platelets: 333
Platelets: 346
Platelets: 366
RBC: 4.33
RDW: 16.5 — ABNORMAL HIGH
RDW: 16.8 — ABNORMAL HIGH
WBC: 7.4
WBC: 9.1
WBC: 9.3

## 2011-07-03 LAB — BASIC METABOLIC PANEL
BUN: 11
BUN: 11
BUN: 7
BUN: 9
CO2: 24
CO2: 25
Calcium: 8.3 — ABNORMAL LOW
Calcium: 8.8
Chloride: 104
Chloride: 105
Creatinine, Ser: 0.93
Creatinine, Ser: 0.95
Creatinine, Ser: 1
GFR calc Af Amer: >60
GFR calc Af Amer: >60
GFR calc non Af Amer: 57 — ABNORMAL LOW
GFR calc non Af Amer: 60 — ABNORMAL LOW
GFR calc non Af Amer: >60
Glucose, Bld: 102 — ABNORMAL HIGH
Glucose, Bld: 94
Potassium: 3.4 — ABNORMAL LOW
Potassium: 3.4 — ABNORMAL LOW
Potassium: 3.8
Potassium: 4.6
Sodium: 131 — ABNORMAL LOW
Sodium: 133 — ABNORMAL LOW
Sodium: 137

## 2011-07-03 LAB — URINALYSIS, ROUTINE W REFLEX MICROSCOPIC
Glucose, UA: NEGATIVE
Hgb urine dipstick: NEGATIVE
Ketones, ur: NEGATIVE
Protein, ur: NEGATIVE
pH: 6

## 2011-07-03 LAB — PREALBUMIN
Prealbumin: 21
Prealbumin: 21.3
Prealbumin: 22.7

## 2011-07-03 LAB — URINE CULTURE

## 2011-07-18 LAB — I-STAT 8, (EC8 V) (CONVERTED LAB)
Acid-base deficit: 4 — ABNORMAL HIGH
BUN: 24 — ABNORMAL HIGH
Bicarbonate: 17.5 — ABNORMAL LOW
Chloride: 109
Glucose, Bld: 94
HCT: 48 — ABNORMAL HIGH
Hemoglobin: 15.6 — ABNORMAL HIGH
Hemoglobin: 16.3 — ABNORMAL HIGH
Operator id: 265201
Potassium: 3.8
Sodium: 133 — ABNORMAL LOW
Sodium: 138
TCO2: 18
TCO2: 20
pCO2, Ven: 22.6 — ABNORMAL LOW

## 2011-07-18 LAB — COMPREHENSIVE METABOLIC PANEL
AST: 25
Albumin: 2.8 — ABNORMAL LOW
Calcium: 8.2 — ABNORMAL LOW
Creatinine, Ser: 0.87
GFR calc Af Amer: >60
Sodium: 136
Total Protein: 6.1

## 2011-07-18 LAB — PREPARE FRESH FROZEN PLASMA

## 2011-07-18 LAB — DIFFERENTIAL
Basophils Absolute: 0
Basophils Absolute: 0
Basophils Relative: 0
Eosinophils Relative: 1
Lymphocytes Relative: 11 — ABNORMAL LOW
Lymphocytes Relative: 16
Monocytes Absolute: 0.5
Monocytes Absolute: 1.1 — ABNORMAL HIGH
Neutro Abs: 7.2
Neutrophils Relative %: 78 — ABNORMAL HIGH

## 2011-07-18 LAB — URINALYSIS, ROUTINE W REFLEX MICROSCOPIC
Ketones, ur: NEGATIVE
Nitrite: POSITIVE — AB
Protein, ur: NEGATIVE
pH: 6

## 2011-07-18 LAB — BASIC METABOLIC PANEL
BUN: 11
BUN: 11
BUN: 18
CO2: 20
CO2: 23
CO2: 24
Calcium: 8.4
Calcium: 8.6
Calcium: 8.8
Chloride: 102
Chloride: 106
Creatinine, Ser: 0.82
Creatinine, Ser: 1.24 — ABNORMAL HIGH
GFR calc Af Amer: 50 — ABNORMAL LOW
GFR calc non Af Amer: 48 — ABNORMAL LOW
GFR calc non Af Amer: >60
GFR calc non Af Amer: >60
Glucose, Bld: 122 — ABNORMAL HIGH
Glucose, Bld: 142 — ABNORMAL HIGH
Glucose, Bld: 93
Glucose, Bld: 99
Potassium: 3.3 — ABNORMAL LOW
Sodium: 135
Sodium: 141

## 2011-07-18 LAB — CK TOTAL AND CKMB (NOT AT ARMC)
CK, MB: 2.4
CK, MB: 2.8
CK, MB: 3.2
Relative Index: 1.6
Relative Index: 1.7
Relative Index: 1.9
Total CK: 141
Total CK: 145
Total CK: 198 — ABNORMAL HIGH

## 2011-07-18 LAB — CULTURE, BLOOD (ROUTINE X 2)

## 2011-07-18 LAB — POCT I-STAT CREATININE
Creatinine, Ser: 1.2
Operator id: 265201

## 2011-07-18 LAB — CBC
HCT: 37.1
HCT: 42.2
Hemoglobin: 12.5
Hemoglobin: 14.5
Hemoglobin: 14.6
MCHC: 33.7
MCHC: 33.7
MCHC: 34.3
MCHC: 34.4
MCV: 90.6
MCV: 91
MCV: 91.9
Platelets: 322
Platelets: 379
Platelets: 389
RBC: 4.04
RDW: 15.3
RDW: 15.6 — ABNORMAL HIGH
RDW: 15.6 — ABNORMAL HIGH
WBC: 10.9 — ABNORMAL HIGH
WBC: 6.9

## 2011-07-18 LAB — URINE MICROSCOPIC-ADD ON

## 2011-07-18 LAB — TYPE AND SCREEN: ABO/RH(D): A POS

## 2011-07-18 LAB — TROPONIN I

## 2011-07-18 LAB — PROTIME-INR: Prothrombin Time: 42.6 — ABNORMAL HIGH

## 2011-07-18 LAB — URINE CULTURE: Colony Count: >=100000

## 2011-07-22 LAB — PROTIME-INR
INR: 1.1
Prothrombin Time: 14.9

## 2011-07-22 LAB — COMPREHENSIVE METABOLIC PANEL
ALT: 17
AST: 23
Alkaline Phosphatase: 36 — ABNORMAL LOW
CO2: 22
Calcium: 10.1
GFR calc Af Amer: 53 — ABNORMAL LOW
Glucose, Bld: 151 — ABNORMAL HIGH
Potassium: 4.1
Sodium: 139
Total Protein: 8

## 2011-07-22 LAB — BASIC METABOLIC PANEL
BUN: 19
CO2: 24
Calcium: 9.4
Chloride: 108
Chloride: 110
Creatinine, Ser: 1.32 — ABNORMAL HIGH
GFR calc Af Amer: 46 — ABNORMAL LOW
GFR calc Af Amer: 49 — ABNORMAL LOW
Glucose, Bld: 102 — ABNORMAL HIGH
Sodium: 141

## 2011-07-22 LAB — MRSA CULTURE

## 2011-07-22 LAB — CBC
HCT: 39.2
Hemoglobin: 13.4
MCHC: 34.2
MCV: 92
MCV: 93
Platelets: 293
Platelets: 346
RBC: 4.27
RDW: 15
RDW: 15.2
WBC: 8.1

## 2011-07-22 LAB — TSH: TSH: 2.026

## 2011-10-29 ENCOUNTER — Encounter (INDEPENDENT_AMBULATORY_CARE_PROVIDER_SITE_OTHER): Payer: Self-pay | Admitting: General Surgery

## 2011-10-30 ENCOUNTER — Encounter (HOSPITAL_COMMUNITY): Payer: Self-pay | Admitting: Emergency Medicine

## 2011-10-30 ENCOUNTER — Observation Stay (HOSPITAL_COMMUNITY)
Admission: EM | Admit: 2011-10-30 | Discharge: 2011-10-31 | Disposition: A | Discharge status: Home / Self Care | Payer: PRIVATE HEALTH INSURANCE | Attending: Internal Medicine | Admitting: Internal Medicine

## 2011-10-30 DIAGNOSIS — K625 Hemorrhage of anus and rectum: Secondary | ICD-10-CM

## 2011-10-30 DIAGNOSIS — I129 Hypertensive chronic kidney disease with stage 1 through stage 4 chronic kidney disease, or unspecified chronic kidney disease: Secondary | ICD-10-CM | POA: Insufficient documentation

## 2011-10-30 DIAGNOSIS — K921 Melena: Principal | ICD-10-CM | POA: Insufficient documentation

## 2011-10-30 DIAGNOSIS — H9190 Unspecified hearing loss, unspecified ear: Secondary | ICD-10-CM | POA: Insufficient documentation

## 2011-10-30 DIAGNOSIS — E785 Hyperlipidemia, unspecified: Secondary | ICD-10-CM | POA: Insufficient documentation

## 2011-10-30 DIAGNOSIS — Z86718 Personal history of other venous thrombosis and embolism: Secondary | ICD-10-CM | POA: Insufficient documentation

## 2011-10-30 DIAGNOSIS — F0390 Unspecified dementia without behavioral disturbance: Secondary | ICD-10-CM | POA: Diagnosis present

## 2011-10-30 DIAGNOSIS — Z7901 Long term (current) use of anticoagulants: Secondary | ICD-10-CM | POA: Insufficient documentation

## 2011-10-30 DIAGNOSIS — K649 Unspecified hemorrhoids: Secondary | ICD-10-CM

## 2011-10-30 DIAGNOSIS — N189 Chronic kidney disease, unspecified: Secondary | ICD-10-CM | POA: Insufficient documentation

## 2011-10-30 DIAGNOSIS — I10 Essential (primary) hypertension: Secondary | ICD-10-CM | POA: Diagnosis present

## 2011-10-30 DIAGNOSIS — G609 Hereditary and idiopathic neuropathy, unspecified: Secondary | ICD-10-CM | POA: Insufficient documentation

## 2011-10-30 DIAGNOSIS — Z8673 Personal history of transient ischemic attack (TIA), and cerebral infarction without residual deficits: Secondary | ICD-10-CM | POA: Insufficient documentation

## 2011-10-30 HISTORY — DX: Hereditary and idiopathic neuropathy, unspecified: G60.9

## 2011-10-30 HISTORY — DX: Presence of other vascular implants and grafts: Z95.828

## 2011-10-30 HISTORY — DX: Acute embolism and thrombosis of unspecified deep veins of unspecified lower extremity: I82.409

## 2011-10-30 HISTORY — DX: Other chronic pain: G89.29

## 2011-10-30 HISTORY — DX: Unspecified dementia, unspecified severity, without behavioral disturbance, psychotic disturbance, mood disturbance, and anxiety: F03.90

## 2011-10-30 HISTORY — DX: Other symptoms and signs involving cognitive functions and awareness: R41.89

## 2011-10-30 HISTORY — DX: Cluster headache syndrome, unspecified, not intractable: G44.009

## 2011-10-30 HISTORY — DX: Headache: R51

## 2011-10-30 HISTORY — DX: Nontraumatic intracerebral hemorrhage, unspecified: I61.9

## 2011-10-30 HISTORY — DX: Other hemorrhoids: K64.8

## 2011-10-30 HISTORY — DX: Cerebral infarction, unspecified: I63.9

## 2011-10-30 LAB — CBC
MCH: 31.9 pg (ref 26.0–34.0)
MCHC: 33.7 g/dL (ref 30.0–36.0)
Platelets: 291 10*3/uL (ref 150–400)
RBC: 4.26 MIL/uL (ref 3.87–5.11)

## 2011-10-30 LAB — HEMOGLOBIN AND HEMATOCRIT, BLOOD
HCT: 36.1 % (ref 36.0–46.0)
Hemoglobin: 12.2 g/dL (ref 12.0–15.0)

## 2011-10-30 LAB — BASIC METABOLIC PANEL
CO2: 23 mEq/L (ref 19–32)
Calcium: 9.9 mg/dL (ref 8.4–10.5)
GFR calc non Af Amer: 47 mL/min — ABNORMAL LOW (ref >90)
Potassium: 4.4 mEq/L (ref 3.5–5.1)
Sodium: 138 mEq/L (ref 135–145)

## 2011-10-30 LAB — PROTIME-INR
INR: 1.08 (ref 0.00–1.49)
Prothrombin Time: 14.2 seconds (ref 11.6–15.2)

## 2011-10-30 MED ORDER — SENNA 8.6 MG PO TABS
1.0000 | ORAL_TABLET | Freq: Two times a day (BID) | ORAL | Status: DC
  Administered 2011-10-30 – 2011-10-31 (×2): 8.6 mg via ORAL
  Filled 2011-10-30 (×3): qty 1

## 2011-10-30 MED ORDER — ALUM & MAG HYDROXIDE-SIMETH 200-200-20 MG/5ML PO SUSP: 30.0000 mL | Freq: Four times a day (QID) | ORAL | Status: DC | PRN

## 2011-10-30 MED ORDER — ADULT MULTIVITAMIN W/MINERALS CH
1.0000 | ORAL_TABLET | Freq: Every day | ORAL | Status: DC
  Administered 2011-10-30 – 2011-10-31 (×2): 1 via ORAL
  Filled 2011-10-30 (×2): qty 1

## 2011-10-30 MED ORDER — ACETAMINOPHEN 650 MG RE SUPP: 650.0000 mg | Freq: Four times a day (QID) | RECTAL | Status: DC | PRN

## 2011-10-30 MED ORDER — SODIUM CHLORIDE 0.9 % IV SOLN: INTRAVENOUS | Status: DC

## 2011-10-30 MED ORDER — HYDROCHLOROTHIAZIDE 25 MG PO TABS
12.5000 mg | ORAL_TABLET | Freq: Every day | ORAL | Status: DC
  Filled 2011-10-30: qty 0.5

## 2011-10-30 MED ORDER — HYDROCODONE-ACETAMINOPHEN 5-325 MG PO TABS: 1.0000 | ORAL_TABLET | ORAL | Status: DC | PRN

## 2011-10-30 MED ORDER — DILTIAZEM HCL 60 MG PO TABS: 120.0000 mg | ORAL_TABLET | Freq: Every day | ORAL | Status: DC

## 2011-10-30 MED ORDER — ACETAMINOPHEN 325 MG PO TABS
650.0000 mg | ORAL_TABLET | Freq: Four times a day (QID) | ORAL | Status: DC | PRN
  Administered 2011-10-31: 650 mg via ORAL
  Filled 2011-10-30: qty 2

## 2011-10-30 MED ORDER — DILTIAZEM HCL ER COATED BEADS 120 MG PO CP24
120.0000 mg | ORAL_CAPSULE | Freq: Every day | ORAL | Status: DC
  Administered 2011-10-30 – 2011-10-31 (×2): 120 mg via ORAL
  Filled 2011-10-30 (×2): qty 1

## 2011-10-30 MED ORDER — CALCIUM CARBONATE-VITAMIN D 500-200 MG-UNIT PO TABS
1.0000 | ORAL_TABLET | Freq: Every day | ORAL | Status: DC
  Administered 2011-10-30 – 2011-10-31 (×2): 1 via ORAL
  Filled 2011-10-30 (×2): qty 1

## 2011-10-30 MED ORDER — DOCUSATE SODIUM 100 MG PO CAPS
100.0000 mg | ORAL_CAPSULE | Freq: Two times a day (BID) | ORAL | Status: DC
  Administered 2011-10-30 – 2011-10-31 (×2): 100 mg via ORAL
  Filled 2011-10-30 (×3): qty 1

## 2011-10-30 MED ORDER — HYDROCORTISONE 2.5 % RE CREA
1.0000 "application " | TOPICAL_CREAM | Freq: Two times a day (BID) | RECTAL | Status: DC
  Administered 2011-10-30 – 2011-10-31 (×2): 1 via RECTAL
  Filled 2011-10-30: qty 28.35

## 2011-10-30 MED ORDER — HYDROCHLOROTHIAZIDE 12.5 MG PO CAPS
12.5000 mg | ORAL_CAPSULE | Freq: Every day | ORAL | Status: DC
  Administered 2011-10-30 – 2011-10-31 (×2): 12.5 mg via ORAL
  Filled 2011-10-30 (×2): qty 1

## 2011-10-30 MED ORDER — ONDANSETRON HCL 4 MG PO TABS: 4.0000 mg | ORAL_TABLET | Freq: Four times a day (QID) | ORAL | Status: DC | PRN

## 2011-10-30 MED ORDER — ALBUTEROL SULFATE (5 MG/ML) 0.5% IN NEBU: 2.5000 mg | INHALATION_SOLUTION | RESPIRATORY_TRACT | Status: DC | PRN

## 2011-10-30 MED ORDER — SODIUM CHLORIDE 0.9 % IV SOLN
INTRAVENOUS | Status: DC
  Administered 2011-10-30: 12:00:00 via INTRAVENOUS

## 2011-10-30 MED ORDER — ONDANSETRON HCL 4 MG/2ML IJ SOLN: 4.0000 mg | Freq: Four times a day (QID) | INTRAMUSCULAR | Status: DC | PRN

## 2011-10-30 MED ORDER — CITALOPRAM HYDROBROMIDE 20 MG PO TABS
20.0000 mg | ORAL_TABLET | Freq: Every day | ORAL | Status: DC
  Administered 2011-10-30 – 2011-10-31 (×2): 20 mg via ORAL
  Filled 2011-10-30 (×2): qty 1

## 2011-10-30 MED ORDER — BIOTENE DRY MOUTH MT LIQD
15.0000 mL | Freq: Two times a day (BID) | OROMUCOSAL | Status: DC
  Administered 2011-10-31: 15 mL via OROMUCOSAL

## 2011-10-30 NOTE — Consult Note (Signed)
As above.  Encourage pt to have daily soft bowel movements, drink 6-8 glasses of water/day, eat a high fiber diet, would recommend a fiber supplement such as metamucil or benefiber (start with low dose and gradually titrate up to normal dose over several weeks to avoid bloating/cramping).  Mary Sella. Andrey Campanile, MD, FACS General, Bariatric, & Minimally Invasive Surgery [facility], [place]

## 2011-10-30 NOTE — Consult Note (Signed)
Reason for Consult:Hemorrhoids Consulting Surgeon: Wilson Referring Physician: Jerral Ralph   HPI: Alice Wu is an 78 y.o. female who lives at home with 24hr caregivers. She has history of hemorrhoids with intermittent bleeding. She was scheduled to see Dr. Derrell Lolling at [facility] next week. The caregiver currently with her states that she will occasionally see some blood on the toilet tissue when wiping, however this morning, a different caregiver noted that there was more blood in the toilet bowl after the pt had a BM. She was concerned and called EMS to bring the pt to the ER. She denies pain and states she otherwise feels well. Her Hgb is normal and she is hemodynamically stable. The medicine team is admitting for observation and serial H&H. The son has requested surgical eval since she is here and was scheduled to see [place] in the office next week anyway.  Past Medical History:  Past Medical History  Diagnosis Date  . Depression   . Hypertension   . Presbyacusia   . Peripheral neuropathy   . Hyperlipidemia   . Chronic kidney disease   . Hearing loss   . Dementia   . Stroke     Surgical History:  Past Surgical History  Procedure Date  . Ivc filter for dvt     Family History: History reviewed. No pertinent family history.  Social History:  reports that she has never smoked. She does not have any smokeless tobacco history on file. She reports that she does not drink alcohol or use illicit drugs.  Allergies: No Known Allergies  Medications: I have reviewed the patient's current medications.  ROS: See HPI for pertinent findings, otherwise complete 10 system review negative.  Physical Exam: Blood pressure 120/53, pulse 61, temperature 98.6 F (37 C), temperature source Oral, resp. rate 20, SpO2 96.00%. Exam limited to rectal; Circumferential edematous external hems, not thrombosed. Soft and NT, small amount residual blood noted.  DRE: pretty good tone, no tenderness, no mass or stricture.  Soft stool palpable, no gross blood noted in vault.   Labs: CBC  Basename 10/30/11 1156  WBC 9.4  HGB 13.6  HCT 40.4  PLT 291   MET  Basename 10/30/11 1156  NA 138  K 4.4  CL 103  CO2 23  GLUCOSE 102*  BUN 24*  CREATININE 1.03  CALCIUM 9.9   No results found for this basename: PROT,ALBUMIN,AST,ALT,ALKPHOS,BILITOT,BILIDIR,IBILI,LIPASE in the last 72 hours PT/INR  Basename 10/30/11 1156  LABPROT 14.2  INR 1.08   ABG No results found for this basename: PHART:2,PCO2:2,PO2:2,HCO3:2 in the last 72 hours    No results found.  Assessment/Plan: Hemorrhoids with intermittent bleeding. To be admitted for observation. No obvious surgical indications. She needs formal eval with anoscopy, which will be done in our office at her scheduled appointment. Attempted to contact son and advise of these recs, left message on voicemail. Call if needed.  Marianna Fuss 10/30/2011, 3:15 PM

## 2011-10-30 NOTE — H&P (Signed)
PATIENT DETAILS Name: Alice Wu Age: 78 y.o. Sex: female Date of Birth: [birth year] Admit Date: 10/30/2011 PCP:No primary provider on file.   CHIEF COMPLAINT:  Hematochezia  HPI: Patient is an 78 year old Caucasian female, with a past medical history of intermittent hematochezia for the past few months-hot to be from hemorrhoids, was scheduled to see a surgeon next Monday comes in with the above noted complaints. Apparently this morning patient had a large bloody bowel movement, since there was a lot more blood than usual, her caregivers called EMS and patient was subsequently brought to the emergency room. She has had no further episodes since then. She denies any abdominal pain or diarrhea. No vomiting or hematemesis. The ED physician on rectal exam found some fresh blood on his finger, subsequently she is now being admitted for further observation. Patient has dementia, and she is not a reliable historian, I have spoken with the patient's son-Alice Wu-telephone number [phone] and have gotten more collaborative history as outlined above. Alice Wu lives with 24-hour caregivers, she has a history of dementia, history of a prior intracranial bleed while on Coumadin therapy, history of a DVT, history of hypertension as well.   ALLERGIES:  No Known Allergies  PAST MEDICAL HISTORY: Past Medical History  Diagnosis Date  . Depression   . Hypertension   . Presbyacusia   . Peripheral neuropathy   . Hyperlipidemia   . Chronic kidney disease   . Hearing loss   . Dementia   . Stroke     PAST SURGICAL HISTORY: Past Surgical History  Procedure Date  . Ivc filter for dvt     MEDICATIONS AT HOME: Prior to Admission medications   Medication Sig Start Date End Date Taking? Authorizing Provider  calcium-vitamin D (OSCAL WITH D) 500-200 MG-UNIT per tablet Take 1 tablet by mouth daily.   Yes Historical Provider, MD  citalopram (CELEXA) 20 MG tablet Take 20 mg by mouth daily.   Yes  Historical Provider, MD  diltiazem (CARDIZEM) 120 MG tablet Take 120 mg by mouth daily.    Yes Historical Provider, MD  hydrochlorothiazide (HYDRODIURIL) 25 MG tablet Take 12.5 mg by mouth daily.   Yes Historical Provider, MD  hydrocortisone (ANUSOL-HC) 2.5 % rectal cream Place 1 application rectally 2 (two) times daily.    Yes Historical Provider, MD  Multiple Vitamins-Minerals (MULTIVITAMIN WITH MINERALS) tablet Take 1 tablet by mouth daily.   Yes Historical Provider, MD    FAMILY HISTORY: History reviewed. No pertinent family history.  SOCIAL HISTORY:  reports that she has never smoked. She does not have any smokeless tobacco history on file. She reports that she does not drink alcohol or use illicit drugs.  REVIEW OF SYSTEMS:  Constitutional:   No  weight loss, night sweats,  Fevers, chills, fatigue.  HEENT:    No headaches, Difficulty swallowing,Tooth/dental problems,Sore throat,  No sneezing, itching, ear ache, nasal congestion, post nasal drip,   Cardio-vascular: No chest pain,  Orthopnea, PND, swelling in lower extremities, anasarca, dizziness, palpitations  GI:  No heartburn, indigestion, abdominal pain, nausea, vomiting, diarrhea, change in bowel habits, loss of appetite  Resp: No shortness of breath with exertion or at rest.  No excess mucus, no productive cough, No non-productive cough,  No coughing up of blood.No change in color of mucus.No wheezing.No chest wall deformity  Skin:  no rash or lesions.  GU:  no dysuria, change in color of urine, no urgency or frequency.  No flank pain.  Musculoskeletal: No joint pain  or swelling.  No decreased range of motion.  No back pain.  Psych: No change in mood or affect. No depression or anxiety.  No memory loss.   PHYSICAL EXAM: Blood pressure 120/53, pulse 61, temperature 98.6 F (37 C), temperature source Oral, resp. rate 20, SpO2 96.00%.  General appearance :Awake, alert, not in any distress. Speech Clear. Not  toxic Looking HEENT: Atraumatic and Normocephalic, pupils equally reactive to light and accomodation Neck: supple, no JVD. No cervical lymphadenopathy.  Chest:Good air entry bilaterally, no added sounds  CVS: S1 S2 regular, no murmurs.  Abdomen: Bowel sounds present, Non tender and not distended with no gaurding, rigidity or rebound. Extremities: B/L Lower Ext shows no edema, both legs are warm to touch, with  dorsalis pedis pulses palpable. Neurology: Awake alert, and oriented X 3, CN II-XII intact, Non focal, Deep Tendon Reflex-2+ all over, plantar's downgoing B/L, sensory exam is grossly intact.  Skin:No Rash Wounds:N/A  LABS ON ADMISSION:   Basename 10/30/11 1156  NA 138  K 4.4  CL 103  CO2 23  GLUCOSE 102*  BUN 24*  CREATININE 1.03  CALCIUM 9.9  MG --  PHOS --   No results found for this basename: AST:2,ALT:2,ALKPHOS:2,BILITOT:2,PROT:2,ALBUMIN:2 in the last 72 hours No results found for this basename: LIPASE:2,AMYLASE:2 in the last 72 hours  Basename 10/30/11 1156  WBC 9.4  NEUTROABS --  HGB 13.6  HCT 40.4  MCV 94.8  PLT 291   No results found for this basename: CKTOTAL:3,CKMB:3,CKMBINDEX:3,TROPONINI:3 in the last 72 hours No results found for this basename: DDIMER:2 in the last 72 hours No components found with this basename: POCBNP:3   RADIOLOGIC STUDIES ON ADMISSION: No results found.  ASSESSMENT AND PLAN: Present on Admission:  .Hematochezia -Likely secondary to known hemorrhoids, a hemoglobin and hematocrit are currently within normal limits.  -Patient will be admitted overnight for observation, just to make sure that she does not have any significant drop in hemoglobin and hematocrit.  -At family's request, surgical consultation will be obtained.  -If the patient were to have recurrent hematochezia causing significant drop in hemoglobin, then perhaps other diagnoses-like diverticulosis will need to be entertained and possible GI consultation would then be  obtained.  -For now we will also type and screen her and monitor her clinical course. H&H will be followed frequently.   Marland KitchenHTN (hypertension) -Currently stable, we will resume her usual medications.   .Dementia -At baseline   Further plan will depend as patient's clinical course evolves and further radiologic and laboratory data become available. Patient will be monitored closely.  DVT Prophylaxis: -Bilateral SCDs for now.  Code Status: Presumed to be a full code-we'll attempt to clarify with the patient's son later.  Total time spent for admission equals 45 minutes.  Jeoffrey Massed 10/30/2011, 2:47 PM

## 2011-10-30 NOTE — ED Notes (Signed)
[id number] Ready 

## 2011-10-30 NOTE — ED Notes (Signed)
Attempted report again. RN not answering phone

## 2011-10-30 NOTE — ED Notes (Signed)
Attempted report to [id number]. RN unavailable.  

## 2011-10-30 NOTE — ED Notes (Signed)
EMS called by patients home caregiver because this AM pt had a large bloody BM. Pt has hx of hemorrhoids but never usually bleeds this much per caregiver. Pt asymptomatic. Denies pain.

## 2011-10-31 LAB — BASIC METABOLIC PANEL
CO2: 23 mEq/L (ref 19–32)
GFR calc non Af Amer: 48 mL/min — ABNORMAL LOW (ref >90)
Glucose, Bld: 99 mg/dL (ref 70–99)
Potassium: 4.2 mEq/L (ref 3.5–5.1)
Sodium: 141 mEq/L (ref 135–145)

## 2011-10-31 LAB — CBC
Hemoglobin: 12.3 g/dL (ref 12.0–15.0)
MCHC: 33.3 g/dL (ref 30.0–36.0)
Platelets: 237 10*3/uL (ref 150–400)
RBC: 3.85 MIL/uL — ABNORMAL LOW (ref 3.87–5.11)

## 2011-10-31 LAB — GLUCOSE, CAPILLARY: Glucose-Capillary: 104 mg/dL — ABNORMAL HIGH (ref 70–99)

## 2011-10-31 MED ORDER — DSS 100 MG PO CAPS: 100.0000 mg | ORAL_CAPSULE | Freq: Two times a day (BID) | ORAL | Status: AC

## 2011-10-31 MED ORDER — HYDROCORTISONE 2.5 % RE CREA: 1.0000 "application " | TOPICAL_CREAM | Freq: Two times a day (BID) | RECTAL | Status: DC

## 2011-10-31 NOTE — Discharge Summary (Signed)
Physician Discharge Summary  Patient ID: Saveah Ashurst MRN: [id number] DOB/AGE: 76-Aug-1922 76 y.o.  Admit date: 10/30/2011 Discharge date: 10/31/2011  Primary Care Physician:  No primary provider on file.   Discharge Diagnoses:   1-Hematochezia 2-HTN (hypertension) 3-Dementia 4-Depression 5-Hemorrhoids 6-HLD 7-Hx of stroke in the past   Current Discharge Medication List    START taking these medications   Details  docusate sodium 100 MG CAPS Take 100 mg by mouth 2 (two) times daily. Qty: 30 capsule, Refills: 1      CONTINUE these medications which have CHANGED   Details  hydrocortisone (ANUSOL-HC) 2.5 % rectal cream Place 1 application rectally 2 (two) times daily. Please use anusol cream schedule twice a day. Qty: 30 g, Refills: 0      CONTINUE these medications which have NOT CHANGED   Details  calcium-vitamin D (OSCAL WITH D) 500-200 MG-UNIT per tablet Take 1 tablet by mouth daily.    citalopram (CELEXA) 20 MG tablet Take 20 mg by mouth daily.    diltiazem (CARDIZEM) 120 MG tablet Take 120 mg by mouth daily.     hydrochlorothiazide (HYDRODIURIL) 25 MG tablet Take 12.5 mg by mouth daily.    Multiple Vitamins-Minerals (MULTIVITAMIN WITH MINERALS) tablet Take 1 tablet by mouth daily.        Disposition and Follow-up:  Patient discharge in stable and improved condition; currently w/o any further hematochezia, N/V, abdominal pain or any other complaints. Patient will follow with Dr. Derrell Lolling on Monday for Anoscopy as an outpatient and determined further evaluation and treatment. Patient advised to use colace bid, high fiber diet, good hydration and to use anusol twice a day. The rest of her problems and also medication regimen remain stable and no changes has been made to it.   Consults:   [facility]   Significant Diagnostic Studies:  No results found.  Brief H and P: 78y/o female admitted secondary to hematochezia after BM's. Patient with hx of hemorrhoids; admitted for  observation and to make sure she remained hemodynamically stable. No others acute complaints or needs.   Hospital Course:  1-Hematochezia: Stopped and w/o any further bleeding during this hospitalization. H/H remains stable overnight. Per surgery recommendations anoscopy at the office on Monday with Dr. Derrell Lolling. Patient advise to follow high fiber diet, good hydration, to use anusol twice a day and to use colace bid.  2-HTN (hypertension): continue current regimen and follow a heart healthy diet.  3-Dementia:Continue supportive care.  4-depression: continue celexa.  Rest of medical problems stable; continue prior to admission medication regimen.   Time spent on Discharge: 45 minutes  Signed: Tallin Hart 10/31/2011, 9:40 AM

## 2011-10-31 NOTE — Progress Notes (Signed)
Pt being discharged back to [facility] today. Iv removed with needle intact and discharge instructiona reviewed with son. Luc Shammas,RN 10/31/11.

## 2011-10-31 NOTE — Progress Notes (Signed)
PT Cancellation/Discharge Note  Treatment cancelled today due to pt discharged from hospital prior to PT eval completion. Marland Kitchen  Alice Wu 10/31/2011, 1:24 PM [name] DPT [phone]

## 2011-11-03 ENCOUNTER — Encounter (INDEPENDENT_AMBULATORY_CARE_PROVIDER_SITE_OTHER): Payer: Self-pay | Admitting: General Surgery

## 2011-11-03 ENCOUNTER — Ambulatory Visit (INDEPENDENT_AMBULATORY_CARE_PROVIDER_SITE_OTHER): Payer: PRIVATE HEALTH INSURANCE | Admitting: General Surgery

## 2011-11-03 VITALS — BP 124/62 | HR 70 | Temp 97.6°F | Resp 18 | Ht 62.0 in | Wt 129.8 lb

## 2011-11-03 DIAGNOSIS — K648 Other hemorrhoids: Secondary | ICD-10-CM

## 2011-11-03 DIAGNOSIS — K644 Residual hemorrhoidal skin tags: Secondary | ICD-10-CM

## 2011-11-03 NOTE — Progress Notes (Signed)
Patient ID: Alice Wu, female   DOB: September 24, 1921, 78 y.o.   MRN: [id number]  Chief Complaint  Patient presents with  . Rectal Problems    Eval painful hemorrhoids    HPI Alice Wu is a 78 y.o. female.  She was referred to me by Dr. Merlene Laughter for evaluation and management of hemorrhoids.  This patient is elderly and has problems with dementia, depression, hypertension. She had a subarachnoid hemorrhage in 2009 while on Coumadin. Coumadin was discontinued and she had an IVC filter placed. She had paroxysmal atrial fibrillation 2009 which was transient. She has chronic kidney disease. She lives at home but requires 24-hour medical assistance and sitters. Her son is with her today as he is one of the home medical assistants.  With that historical background, she has been  having problems with hemorrhoids. She has problems with bleeding and discomfort. She's been using baby wipes. The bleeding is intermittent. She has bowel movements fairly regularly. She is not a good historian.  She was hospitalized on January 17 because of rectal bleeding. She was admitted by the medical service and seen by our group. The bleeding stopped it was thought to be due to her hemorrhoids. She is at minimal bleeding since that time. She is here for my evaluation. HPI  Past Medical History  Diagnosis Date  . Depression   . Presbyacusia   . Peripheral neuropathy   . Hyperlipidemia   . Chronic kidney disease   . Hearing loss   . Stroke   . 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 DVT of leg (deep venous thrombosis) 2008    ?left  . Idiopathic neuropathy   . Hypertension     "related to h/o cerebral hemorrhage 2008; dr put her on meds since that hx"  . Presence of IVC filter 2008  . Hemorrhoids, internal, with bleeding   . Cluster headaches     "during middle age"  . Chronic headaches     daily since CVA 2008  . Dementia     Past  Surgical History  Procedure Date  . Vena cava filter placement 2008    "for DVT"  . Tonsillectomy and adenoidectomy     "when I was a kid"    History reviewed. No pertinent family history.  Social History History  Substance Use Topics  . Smoking status: Never Smoker   . Smokeless tobacco: Never Used  . Alcohol Use: No    No Known Allergies  Current Outpatient Prescriptions  Medication Sig Dispense Refill  . SF 5000 PLUS 1.1 % CREA dental cream as needed.      . calcium-vitamin D (OSCAL WITH D) 500-200 MG-UNIT per tablet Take 1 tablet by mouth daily.      . citalopram (CELEXA) 20 MG tablet Take 20 mg by mouth daily.      Marland Kitchen diltiazem (CARDIZEM) 120 MG tablet Take 120 mg by mouth daily.       Marland Kitchen docusate sodium 100 MG CAPS Take 100 mg by mouth 2 (two) times daily.  30 capsule  1  . hydrochlorothiazide (HYDRODIURIL) 25 MG tablet Take 12.5 mg by mouth daily.      . hydrocortisone (ANUSOL-HC) 2.5 % rectal cream Place 1 application rectally 2 (two) times daily. Please use anusol cream schedule twice a day.  30 g  0  . Multiple Vitamins-Minerals (MULTIVITAMIN WITH MINERALS) tablet Take 1 tablet by mouth daily.  Review of Systems Review of Systems  Constitutional: Positive for activity change. Negative for fever, chills and unexpected weight change.  HENT: Negative for hearing loss, congestion, sore throat, trouble swallowing and voice change.   Eyes: Negative for visual disturbance.  Respiratory: Negative for cough and wheezing.   Cardiovascular: Negative for chest pain, palpitations and leg swelling.  Gastrointestinal: Positive for anal bleeding and rectal pain. Negative for nausea, vomiting, abdominal pain, diarrhea, constipation, blood in stool and abdominal distention.  Genitourinary: Negative for hematuria, vaginal bleeding and difficulty urinating.  Musculoskeletal: Positive for myalgias and back pain. Negative for arthralgias.  Skin: Negative for rash and wound.    Neurological: Negative for seizures, syncope and headaches.  Hematological: Negative for adenopathy. Does not bruise/bleed easily.  Psychiatric/Behavioral: Positive for confusion. The patient is nervous/anxious.     Blood pressure 124/62, pulse 70, temperature 97.6 F (36.4 C), temperature source Temporal, resp. rate 18, height 5\' 2"  (1.575 m), weight 129 lb 12.8 oz (58.877 kg).  Physical Exam Physical Exam  Constitutional: She appears well-developed and well-nourished. No distress.       Elderly. Moves slowly, requires help with positioning and clothing.  Son is with her.  HENT:  Head: Normocephalic and atraumatic.  Eyes: EOM are normal.  Neck: Neck supple. No JVD present. No tracheal deviation present. No thyromegaly present.  Cardiovascular: Normal rate, regular rhythm, normal heart sounds and intact distal pulses.   No murmur heard. Pulmonary/Chest: Effort normal and breath sounds normal. No respiratory distress. She has no wheezes. She has no rales. She exhibits no tenderness.  Abdominal: Soft. Bowel sounds are normal. She exhibits no distension and no mass. There is no tenderness. There is no rebound and no guarding.  Genitourinary:     Lymphadenopathy:    She has no cervical adenopathy.  Neurological: She is alert. She exhibits normal muscle tone. Coordination abnormal.  Skin: Skin is warm. No rash noted. She is not diaphoretic. No erythema. No pallor.  Psychiatric: She has a normal mood and affect. Her behavior is normal.    Data Reviewed I reviewed her recent hospital records. I reviewed Dr. Laverle Hobby recent office notes.  Assessment    Bleeding internal hemorrhoids. Injected with sclerosing solution today.  External hemorrhoids, somewhat symptomatic but without complication.  Significant dementia and depression. Requires full time in-home care.  Hypertension  History subarachnoid  hemorrhaged 2009 while on Coumadin  Status post IVC filter  placement  Chronic kidney disease  History of paroxysmal atrial fibrillation, currently seems to be in sinus rhythm.    Plan    Internal hemorrhoids injected today without problem  High fiber, low fat diet with hydration urged  Use baby wipes continuously  Analpram-HC 2.5% cream prescription given to applied to external hemorrhoids twice daily  Return in one month to see if we have improved her symptoms.  She is not a good candidate for general anesthesia and hemorrhoidectomy.   Angelia Mould. Derrell Lolling, M.D., [facility] General and Minimally invasive Surgery Breast and Colorectal Surgery Office:   [phone] Pager:   [phone]     11/03/2011, 12:05 PM

## 2011-11-03 NOTE — Patient Instructions (Signed)
We injected a sclerosing solution into your internal hemorrhoids today to try to stop the bleeding. You have  been given a prescription for medicated cream to put on the external hemorrhoids twice a day. Please drink lots of water. Continued daily stool softeners. Return to see me in one month and we'll see how you are doing.

## 2011-11-06 ENCOUNTER — Telehealth (INDEPENDENT_AMBULATORY_CARE_PROVIDER_SITE_OTHER): Payer: Self-pay

## 2011-11-06 NOTE — Telephone Encounter (Signed)
Received refill request for pamoxine/hc rectal cream. RX request to HMI desk for him to review,sign and return to be faxed. Will await him to return request.

## 2011-11-10 NOTE — ED Provider Notes (Signed)
History     CSN: [id number]  Arrival date & time 10/30/11  1022   First MD Initiated Contact with Patient 10/30/11 1040      Chief Complaint  Patient presents with  . Rectal Bleeding    (Consider location/radiation/quality/duration/timing/severity/associated sxs/prior treatment) Patient is a 78 y.o. female presenting with hematochezia. The history is provided by the EMS personnel, a caregiver and a relative.  Rectal Bleeding  The current episode started today. The problem has been rapidly improving. The patient is experiencing no pain. The stool is described as bloody. Associated symptoms include hemorrhoids. Pertinent negatives include no fever, no abdominal pain, no diarrhea, no nausea, no rectal pain, no vomiting, no hematuria, no vaginal bleeding, no headaches, no coughing and no difficulty breathing.   Patient with dementia historical facts confused. Most of hx from son and caregiver.  Level 5 Caveat applies.   Large bloody BM this AM none since.   Past Medical History  Diagnosis Date  . Depression   . Presbyacusia   . Peripheral neuropathy   . Hyperlipidemia   . Chronic kidney disease   . Hearing loss   . Stroke   . 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 DVT of leg (deep venous thrombosis) 2008    ?left  . Idiopathic neuropathy   . Hypertension     "related to h/o cerebral hemorrhage 2008; dr put her on meds since that hx"  . Presence of IVC filter 2008  . Hemorrhoids, internal, with bleeding   . Cluster headaches     "during middle age"  . Chronic headaches     daily since CVA 2008  . Dementia     Past Surgical History  Procedure Date  . Vena cava filter placement 2008    "for DVT"  . Tonsillectomy and adenoidectomy     "when I was a kid"    History reviewed. No pertinent family history.  History  Substance Use Topics  . Smoking status: Never Smoker   . Smokeless tobacco: Never Used  .  Alcohol Use: No    OB History    Grav Para Term Preterm Abortions TAB SAB Ect Mult Living                  Review of Systems  Constitutional: Negative for fever.  HENT: Negative for congestion.   Eyes: Negative for redness.  Respiratory: Negative for cough.   Gastrointestinal: Positive for blood in stool, hematochezia, anal bleeding and hemorrhoids. Negative for nausea, vomiting, abdominal pain, diarrhea and rectal pain.  Genitourinary: Negative for hematuria and vaginal bleeding.  Musculoskeletal: Negative for back pain.  Neurological: Negative for headaches.  Hematological: Does not bruise/bleed easily.    Allergies  Review of patient's allergies indicates no known allergies.  Home Medications   Current Outpatient Rx  Name Route Sig Dispense Refill  . CALCIUM CARBONATE-VITAMIN D 500-200 MG-UNIT PO TABS Oral Take 1 tablet by mouth daily.    Marland Kitchen CITALOPRAM HYDROBROMIDE 20 MG PO TABS Oral Take 20 mg by mouth daily.    Marland Kitchen DILTIAZEM HCL 120 MG PO TABS Oral Take 120 mg by mouth daily.     Marland Kitchen HYDROCHLOROTHIAZIDE 25 MG PO TABS Oral Take 12.5 mg by mouth daily.    . MULTI-VITAMIN/MINERALS PO TABS Oral Take 1 tablet by mouth daily.    . DSS 100 MG PO CAPS Oral Take 100 mg by mouth 2 (two) times daily.  30 capsule 1  . HYDROCORTISONE 2.5 % RE CREA Rectal Place 1 application rectally 2 (two) times daily. Please use anusol cream schedule twice a day. 30 g 0  . SF 5000 PLUS 1.1 % DT CREA  as needed.      BP 109/68  Pulse 62  Temp(Src) 98.4 F (36.9 C) (Axillary)  Resp 20  Ht 5\' 5"  (1.651 m)  Wt 173 lb 9.6 oz (78.744 kg)  BMI 28.89 kg/m2  SpO2 96%  Physical Exam  Nursing note and vitals reviewed. HENT:  Head: Normocephalic and atraumatic.  Mouth/Throat: Oropharynx is clear and moist.  Eyes: Conjunctivae and EOM are normal. Pupils are equal, round, and reactive to light.  Neck: Normal range of motion. Neck supple.  Cardiovascular: Normal rate, regular rhythm, normal heart sounds  and intact distal pulses.   No murmur heard. Pulmonary/Chest: Effort normal and breath sounds normal.  Abdominal: Soft. Bowel sounds are normal. There is no tenderness.  Genitourinary:       Gross blood on rectal no sig stool. No fissure. No prolapsed internal hemorrhoids.   Musculoskeletal: Normal range of motion.  Neurological: She is alert. No cranial nerve deficit. She exhibits normal muscle tone. Coordination normal.  Skin: Skin is warm. No rash noted.    ED Course  Procedures (including critical care time)  Labs Reviewed  BASIC METABOLIC PANEL - Abnormal; Notable for the following:    Glucose, Bld 102 (*)    BUN 24 (*)    GFR calc non Af Amer 47 (*)    GFR calc Af Amer 55 (*)    All other components within normal limits  CBC - Abnormal; Notable for the following:    RBC 3.85 (*)    All other components within normal limits  BASIC METABOLIC PANEL - Abnormal; Notable for the following:    GFR calc non Af Amer 48 (*)    GFR calc Af Amer 56 (*)    All other components within normal limits  GLUCOSE, CAPILLARY - Abnormal; Notable for the following:    Glucose-Capillary 104 (*)    All other components within normal limits  CBC  PROTIME-INR  HEMOGLOBIN AND HEMATOCRIT, BLOOD  LAB REPORT - SCANNED   No results found. Results for orders placed during the hospital encounter of 10/30/11  CBC      Component Value Range   WBC 9.4  4.0 - 10.5 (K/uL)   RBC 4.26  3.87 - 5.11 (MIL/uL)   Hemoglobin 13.6  12.0 - 15.0 (g/dL)   HCT 16.1  09.6 - 04.5 (%)   MCV 94.8  78.0 - 100.0 (fL)   MCH 31.9  26.0 - 34.0 (pg)   MCHC 33.7  30.0 - 36.0 (g/dL)   RDW 40.9  81.1 - 91.4 (%)   Platelets 291  150 - 400 (K/uL)  BASIC METABOLIC PANEL      Component Value Range   Sodium 138  135 - 145 (mEq/L)   Potassium 4.4  3.5 - 5.1 (mEq/L)   Chloride 103  96 - 112 (mEq/L)   CO2 23  19 - 32 (mEq/L)   Glucose, Bld 102 (*) 70 - 99 (mg/dL)   BUN 24 (*) 6 - 23 (mg/dL)   Creatinine, Ser 7.82  0.50 - 1.10  (mg/dL)   Calcium 9.9  8.4 - 95.6 (mg/dL)   GFR calc non Af Amer 47 (*) >90 (mL/min)   GFR calc Af Amer 55 (*) >90 (mL/min)  PROTIME-INR  Component Value Range   Prothrombin Time 14.2  11.6 - 15.2 (seconds)   INR 1.08  0.00 - 1.49   CBC      Component Value Range   WBC 8.1  4.0 - 10.5 (K/uL)   RBC 3.85 (*) 3.87 - 5.11 (MIL/uL)   Hemoglobin 12.3  12.0 - 15.0 (g/dL)   HCT 16.1  09.6 - 04.5 (%)   MCV 95.8  78.0 - 100.0 (fL)   MCH 31.9  26.0 - 34.0 (pg)   MCHC 33.3  30.0 - 36.0 (g/dL)   RDW 40.9  81.1 - 91.4 (%)   Platelets 237  150 - 400 (K/uL)  BASIC METABOLIC PANEL      Component Value Range   Sodium 141  135 - 145 (mEq/L)   Potassium 4.2  3.5 - 5.1 (mEq/L)   Chloride 108  96 - 112 (mEq/L)   CO2 23  19 - 32 (mEq/L)   Glucose, Bld 99  70 - 99 (mg/dL)   BUN 19  6 - 23 (mg/dL)   Creatinine, Ser 7.82  0.50 - 1.10 (mg/dL)   Calcium 9.2  8.4 - 95.6 (mg/dL)   GFR calc non Af Amer 48 (*) >90 (mL/min)   GFR calc Af Amer 56 (*) >90 (mL/min)  HEMOGLOBIN AND HEMATOCRIT, BLOOD      Component Value Range   Hemoglobin 12.2  12.0 - 15.0 (g/dL)   HCT 21.3  08.6 - 57.8 (%)  GLUCOSE, CAPILLARY      Component Value Range   Glucose-Capillary 104 (*) 70 - 99 (mg/dL)     1. Rectal bleeding       MDM  GI Bleed with large single episode of rectal bleeding this AM. Stable in ED and stable h/h. But needs observation. Rectal with blood but etiology not clear, no prolapsed internal hemorrhoids or fissure. Patient does have hx of hemorrhoids. Hx of dementia.   Admission arranged.         Shelda Jakes, MD 11/10/11 2040

## 2011-12-09 ENCOUNTER — Ambulatory Visit (INDEPENDENT_AMBULATORY_CARE_PROVIDER_SITE_OTHER): Payer: PRIVATE HEALTH INSURANCE | Admitting: General Surgery

## 2011-12-09 ENCOUNTER — Encounter (INDEPENDENT_AMBULATORY_CARE_PROVIDER_SITE_OTHER): Payer: Self-pay | Admitting: General Surgery

## 2011-12-09 VITALS — BP 116/80 | HR 80 | Temp 98.0°F | Resp 16 | Ht 62.0 in | Wt 162.4 lb

## 2011-12-09 DIAGNOSIS — K648 Other hemorrhoids: Secondary | ICD-10-CM

## 2011-12-09 NOTE — Patient Instructions (Signed)
The bleeding from her hemorrhoids has stopped and that is the primary goal of our treatment.        Continue to use the hydrocortisone cream twice a day as needed to control discomfort and tenderness.       Return to see Dr. Derrell Lolling if any new problems arise.

## 2011-12-09 NOTE — Progress Notes (Signed)
Subjective:     Patient ID: Alice Wu, female   DOB: 10-08-1921, 78 y.o.   MRN: [id number]  HPI This 78 year old woman is seen in followup for her hemorrhoids.  She has significant medical problems with dementia, depression, hypertension. She has subarachnoid hemorrhage 2009 and had to have an IVC filter placed. She lives in independent living with a 24-hour in home medical assistance.  On November 03, 2011  I saw her because of bleeding internal hemorrhoids. I injected these extensively with sclerosing solution. After a few days the bleeding stopped and she has had no further bleeding. She's having bowel movements without much difficulty. The family thinks she is having a little bit of discomfort.  Review of Systems     Objective:   Physical Exam Patient is awake. In a wheelchair. No distress. Poor memory. Poor insight.  Rectal external exam revealed some external hemorrhoids. No thrombosis. No infection. No bleeding. No clots. Minimal tenderness.    Assessment:     Bleeding internal hemorrhoids. Bleeding has resolved with injection therapy. We have achieved the primary goal of treatment.  External hemorrhoids with minor tenderness.    Plan:     Daily stool softeners.  Analpram-HC 2.5% cream b.i.d. p.r.n. tenderness  Return to see me p.r.n.   Angelia Mould. Derrell Lolling, M.D., [facility] General and Minimally invasive Surgery Breast and Colorectal Surgery Office:   [phone] Pager:   [phone]

## 2012-07-27 ENCOUNTER — Emergency Department (HOSPITAL_COMMUNITY)
Admission: EM | Admit: 2012-07-27 | Discharge: 2012-07-28 | Disposition: A | Discharge status: Home / Self Care | Payer: PRIVATE HEALTH INSURANCE | Attending: Emergency Medicine | Admitting: Emergency Medicine

## 2012-07-27 ENCOUNTER — Encounter (HOSPITAL_COMMUNITY): Payer: Self-pay

## 2012-07-27 DIAGNOSIS — K649 Unspecified hemorrhoids: Secondary | ICD-10-CM

## 2012-07-27 DIAGNOSIS — K648 Other hemorrhoids: Secondary | ICD-10-CM | POA: Insufficient documentation

## 2012-07-27 DIAGNOSIS — K921 Melena: Secondary | ICD-10-CM | POA: Insufficient documentation

## 2012-07-27 DIAGNOSIS — Z79899 Other long term (current) drug therapy: Secondary | ICD-10-CM | POA: Insufficient documentation

## 2012-07-27 DIAGNOSIS — K644 Residual hemorrhoidal skin tags: Secondary | ICD-10-CM | POA: Insufficient documentation

## 2012-07-27 LAB — CBC WITH DIFFERENTIAL/PLATELET
Basophils Relative: 1 % (ref 0–1)
Hemoglobin: 12.7 g/dL (ref 12.0–15.0)
Lymphs Abs: 2.3 10*3/uL (ref 0.7–4.0)
Monocytes Relative: 13 % — ABNORMAL HIGH (ref 3–12)
Neutro Abs: 5.3 10*3/uL (ref 1.7–7.7)
Neutrophils Relative %: 57 % (ref 43–77)
RBC: 4.02 MIL/uL (ref 3.87–5.11)
WBC: 9.2 10*3/uL (ref 4.0–10.5)

## 2012-07-27 LAB — COMPREHENSIVE METABOLIC PANEL
ALT: 8 U/L (ref 0–35)
Albumin: 3.5 g/dL (ref 3.5–5.2)
Alkaline Phosphatase: 61 U/L (ref 39–117)
BUN: 22 mg/dL (ref 6–23)
Chloride: 106 mEq/L (ref 96–112)
Glucose, Bld: 109 mg/dL — ABNORMAL HIGH (ref 70–99)
Potassium: 4 mEq/L (ref 3.5–5.1)
Total Bilirubin: 0.5 mg/dL (ref 0.3–1.2)

## 2012-07-27 LAB — TYPE AND SCREEN: Antibody Screen: NEGATIVE

## 2012-07-27 LAB — PROTIME-INR: Prothrombin Time: 13.9 seconds (ref 11.6–15.2)

## 2012-07-27 MED ORDER — SODIUM CHLORIDE 0.9 % IV SOLN
INTRAVENOUS | Status: DC
  Administered 2012-07-27: 22:00:00 via INTRAVENOUS

## 2012-07-27 MED ORDER — HYDROCORTISONE ACE-PRAMOXINE 1-1 % RE FOAM: 1.0000 | Freq: Two times a day (BID) | RECTAL | Status: DC

## 2012-07-27 NOTE — ED Provider Notes (Addendum)
History     CSN: [id number]  Arrival date & time 07/27/12  2126   First MD Initiated Contact with Patient 07/27/12 2136      Chief Complaint  Patient presents with  . Rectal Bleeding    (Consider location/radiation/quality/duration/timing/severity/associated sxs/prior treatment) Patient is a 78 y.o. female presenting with hematochezia. The history is provided by the patient.  Rectal Bleeding    patient here with bright red blood per rectum x1. History of internal hemorrhoids with treatment with sclerotherapy. Denies abdominal pain or fever. No vomiting. No dizziness or orthostasis. Denies any hard stools. No treatment done prior to arrival and nothing makes her symptoms better and desiccation makes them worse. She does note some pressure in her anal region.  Past Medical History  Diagnosis Date  . Depression   . Presbyacusia   . Peripheral neuropathy   . Hyperlipidemia   . Chronic kidney disease   . Hearing loss   . Stroke   . 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 DVT of leg (deep venous thrombosis) 2008    ?left  . Idiopathic neuropathy   . Hypertension     "related to h/o cerebral hemorrhage 2008; dr put her on meds since that hx"  . Presence of IVC filter 2008  . Hemorrhoids, internal, with bleeding   . Cluster headaches     "during middle age"  . Chronic headaches     daily since CVA 2008  . Dementia     Past Surgical History  Procedure Date  . Vena cava filter placement 2008    "for DVT"  . Tonsillectomy and adenoidectomy     "when I was a kid"    No family history on file.  History  Substance Use Topics  . Smoking status: Never Smoker   . Smokeless tobacco: Never Used  . Alcohol Use: No    OB History    Grav Para Term Preterm Abortions TAB SAB Ect Mult Living                  Review of Systems  Gastrointestinal: Positive for hematochezia.  All other systems reviewed and are  negative.    Allergies  Review of patient's allergies indicates no known allergies.  Home Medications   Current Outpatient Rx  Name Route Sig Dispense Refill  . CALCIUM CARBONATE-VITAMIN D 500-200 MG-UNIT PO TABS Oral Take 1 tablet by mouth daily.    Marland Kitchen CITALOPRAM HYDROBROMIDE 20 MG PO TABS Oral Take 20 mg by mouth daily.    Marland Kitchen DILTIAZEM HCL 120 MG PO TABS Oral Take 120 mg by mouth daily.     Marland Kitchen HYDROCHLOROTHIAZIDE 25 MG PO TABS Oral Take 12.5 mg by mouth daily.    Marland Kitchen HYDROCORTISONE ACE-PRAMOXINE 2.5-1 % RE CREA Rectal Place 1 application rectally daily as needed. For flare ups    . MULTI-VITAMIN/MINERALS PO TABS Oral Take 1 tablet by mouth daily.    Marland Kitchen OVER THE COUNTER MEDICATION Rectal Place 1 application rectally daily as needed. For hemorrhoids    . SF 5000 PLUS 1.1 % DT CREA dental Place 1 application onto teeth daily.       BP 110/64  Pulse 56  Temp 97.6 F (36.4 C) (Oral)  Resp 18  SpO2 98%  Physical Exam  Nursing note and vitals reviewed. Constitutional: She is oriented to person, place, and time. She appears well-developed and well-nourished.  Non-toxic appearance. No distress.  HENT:  Head: Normocephalic and atraumatic.  Eyes: Conjunctivae normal, EOM and lids are normal. Pupils are equal, round, and reactive to light.  Neck: Normal range of motion. Neck supple. No tracheal deviation present. No mass present.  Cardiovascular: Normal rate, regular rhythm and normal heart sounds.  Exam reveals no gallop.   No murmur heard. Pulmonary/Chest: Effort normal and breath sounds normal. No stridor. No respiratory distress. She has no decreased breath sounds. She has no wheezes. She has no rhonchi. She has no rales.  Abdominal: Soft. Normal appearance and bowel sounds are normal. She exhibits no distension. There is no tenderness. There is no rebound and no CVA tenderness.  Genitourinary: Rectal exam shows external hemorrhoid and internal hemorrhoid.  Musculoskeletal: Normal range  of motion. She exhibits no edema and no tenderness.  Neurological: She is alert and oriented to person, place, and time. She has normal strength. No cranial nerve deficit or sensory deficit. GCS eye subscore is 4. GCS verbal subscore is 5. GCS motor subscore is 6.  Skin: Skin is warm and dry. No abrasion and no rash noted.  Psychiatric: She has a normal mood and affect. Her speech is normal and behavior is normal.    ED Course  Procedures (including critical care time)   Labs Reviewed  CBC WITH DIFFERENTIAL  COMPREHENSIVE METABOLIC PANEL  PROTIME-INR  APTT  TYPE AND SCREEN   No results found.   No diagnosis found.    MDM  Patient's hemoglobin is stable at this time. Patient to see the surgeon tomorrow for treatment of her hemorrhoids. Patient's symptoms are similar to when she was seen by the surgeon back in February of this year where it was treated in the office        Toy Baker, MD 07/27/12 1610  Toy Baker, MD 07/27/12 2325

## 2012-07-27 NOTE — ED Notes (Signed)
Pt has had no observed or reported episodes of rectal bleeding, will continue to monitor pt.

## 2012-07-27 NOTE — ED Notes (Addendum)
Pt from [facility] with complaints of rectal bleeding x 20 minutes ago. EMS states pt has filled toilet to waterline bright red blood x 2. Pt denies any pain at this time.

## 2012-07-27 NOTE — Discharge Instructions (Signed)
Call the surgeon tomorrow to schedule an appointment to be seen in the office Hemorrhoids Hemorrhoids are enlarged (dilated) veins around the rectum. There are 2 types of hemorrhoids, and the type of hemorrhoid is determined by its location. Internal hemorrhoids occur in the veins just inside the rectum.They are usually not painful, but they may bleed.However, they may poke through to the outside and become irritated and painful. External hemorrhoids involve the veins outside the anus and can be felt as a painful swelling or hard lump near the anus.They are often itchy and may crack and bleed. Sometimes clots will form in the veins. This makes them swollen and painful. These are called thrombosed hemorrhoids. CAUSES Causes of hemorrhoids include:  Pregnancy. This increases the pressure in the hemorrhoidal veins.  Constipation.  Straining to have a bowel movement.  Obesity.  Heavy lifting or other activity that caused you to strain. TREATMENT Most of the time hemorrhoids improve in 1 to 2 weeks. However, if symptoms do not seem to be getting better or if you have a lot of rectal bleeding, your caregiver may perform a procedure to help make the hemorrhoids get smaller or remove them completely.Possible treatments include:  Rubber band ligation. A rubber band is placed at the base of the hemorrhoid to cut off the circulation.  Sclerotherapy. A chemical is injected to shrink the hemorrhoid.  Infrared light therapy. Tools are used to burn the hemorrhoid.  Hemorrhoidectomy. This is surgical removal of the hemorrhoid. HOME CARE INSTRUCTIONS   Increase fiber in your diet. Ask your caregiver about using fiber supplements.  Drink enough water and fluids to keep your urine clear or pale yellow.  Exercise regularly.  Go to the bathroom when you have the urge to have a bowel movement. Do not wait.  Avoid straining to have bowel movements.  Keep the anal area dry and clean.  Only take  over-the-counter or prescription medicines for pain, discomfort, or fever as directed by your caregiver. If your hemorrhoids are thrombosed:  Take warm sitz baths for 20 to 30 minutes, 3 to 4 times per day.  If the hemorrhoids are very tender and swollen, place ice packs on the area as tolerated. Using ice packs between sitz baths may be helpful. Fill a plastic bag with ice. Place a towel between the bag of ice and your skin.  Medicated creams and suppositories may be used or applied as directed.  Do not use a donut-shaped pillow or sit on the toilet for long periods. This increases blood pooling and pain. SEEK MEDICAL CARE IF:   You have increasing pain and swelling that is not controlled with your medicine.  You have uncontrolled bleeding.  You have difficulty or you are unable to have a bowel movement.  You have pain or inflammation outside the area of the hemorrhoids.  You have chills or an oral temperature above 102 F (38.9 C). MAKE SURE YOU:   Understand these instructions.  Will watch your condition.  Will get help right away if you are not doing well or get worse. Document Released: 09/26/2000 Document Revised: 12/22/2011 Document Reviewed: 09/09/2010 [facility] Patient Information 2013 [place], [place].

## 2012-07-28 NOTE — ED Notes (Signed)
Pt discharged with son and caregiver, instructions given to son with verbal understanding.

## 2012-08-10 ENCOUNTER — Encounter (INDEPENDENT_AMBULATORY_CARE_PROVIDER_SITE_OTHER): Payer: Self-pay | Admitting: General Surgery

## 2012-08-10 ENCOUNTER — Ambulatory Visit (INDEPENDENT_AMBULATORY_CARE_PROVIDER_SITE_OTHER): Payer: PRIVATE HEALTH INSURANCE | Admitting: General Surgery

## 2012-08-10 VITALS — BP 116/64 | HR 72 | Temp 98.4°F | Resp 20 | Ht 62.5 in | Wt 165.0 lb

## 2012-08-10 DIAGNOSIS — K648 Other hemorrhoids: Secondary | ICD-10-CM

## 2012-08-10 NOTE — Progress Notes (Signed)
Patient ID: Alice Wu, female   DOB: May 18, 1921, 78 y.o.   MRN: [id number] This elderly woman is brought by her family because of recurrent rectal bleeding. On 10/26/2011 I injected the internal hemorrhoids and that stopped her bleeding for cervical Motts. She had another episode of bleeding and went to the emergency room on October 15. Hemoglobin was normal. History is not great but it doesn't sound like she's been having a lot of bleeding symptoms. Her comorbidities include dementia, depression, hypertension, subarachnoid hemorrhage 2009, IVC filter in place, deconditioning. She lives at [facility] but has 24-hour in home care  Exam: Patient is here with her daughter. Lots of mobility problems. We were able to get her up on the exam table in decubitus position. External hemorrhoids circumferential, soft, nontender, not thrombosed. Circumferential internal hemorrhoids injected with sclerosing solution. No active bleeding or clots. Tolerated well  Assessment: Bleeding internal hemorrhoids, injected today External hemorrhoids, asymptomatic She is not a candidate for surgical hemorrhoidectomy because of her advanced age  Plan: Stool softeners twice daily. Hydration. Return to see me when necessary.   Angelia Mould. Derrell Lolling, M.D., [facility] General and Minimally invasive Surgery Breast and Colorectal Surgery Office:   [phone] Pager:   [phone]

## 2012-08-10 NOTE — Patient Instructions (Signed)
There was no blood or evidence of active bleeding today.  Dr. Derrell Lolling injected all of the internal hemorrhoids with sclerosing solution, and hopefully this will prevent bleeding..  There is no indication for major surgery due to her advanced age.  Return to see Dr. Derrell Lolling if further problems arise.

## 2013-01-14 ENCOUNTER — Ambulatory Visit: Payer: Medicare Other | Admitting: Family Medicine

## 2013-01-14 VITALS — BP 105/64 | HR 86 | Temp 97.6°F | Resp 18

## 2013-01-14 DIAGNOSIS — N390 Urinary tract infection, site not specified: Secondary | ICD-10-CM

## 2013-01-14 MED ORDER — CIPROFLOXACIN HCL 500 MG PO TABS: 500.0000 mg | ORAL_TABLET | Freq: Two times a day (BID) | ORAL | Status: DC

## 2013-01-14 NOTE — Progress Notes (Signed)
  79 yo woman with UTI diagnosed 10 days ago but has become somnolent since stopping the antibiotic which was prescribed in [place].  Objective:  Somnolent  Records from [place] reviewed.  Assessment:  UTI  Plan:

## 2013-10-06 ENCOUNTER — Other Ambulatory Visit: Payer: Self-pay

## 2013-10-06 ENCOUNTER — Emergency Department (HOSPITAL_COMMUNITY): Payer: PRIVATE HEALTH INSURANCE

## 2013-10-06 ENCOUNTER — Observation Stay (HOSPITAL_COMMUNITY)
Admission: EM | Admit: 2013-10-06 | Discharge: 2013-10-07 | Disposition: A | Discharge status: Skilled Nursing Facility | Payer: PRIVATE HEALTH INSURANCE | Attending: Internal Medicine | Admitting: Internal Medicine

## 2013-10-06 ENCOUNTER — Encounter (HOSPITAL_COMMUNITY): Payer: Self-pay | Admitting: Emergency Medicine

## 2013-10-06 DIAGNOSIS — W19XXXA Unspecified fall, initial encounter: Secondary | ICD-10-CM

## 2013-10-06 DIAGNOSIS — F0390 Unspecified dementia without behavioral disturbance: Secondary | ICD-10-CM | POA: Insufficient documentation

## 2013-10-06 DIAGNOSIS — I10 Essential (primary) hypertension: Secondary | ICD-10-CM | POA: Insufficient documentation

## 2013-10-06 DIAGNOSIS — G609 Hereditary and idiopathic neuropathy, unspecified: Secondary | ICD-10-CM | POA: Insufficient documentation

## 2013-10-06 DIAGNOSIS — G319 Degenerative disease of nervous system, unspecified: Secondary | ICD-10-CM | POA: Insufficient documentation

## 2013-10-06 DIAGNOSIS — S42009A Fracture of unspecified part of unspecified clavicle, initial encounter for closed fracture: Secondary | ICD-10-CM

## 2013-10-06 DIAGNOSIS — Z86718 Personal history of other venous thrombosis and embolism: Secondary | ICD-10-CM | POA: Insufficient documentation

## 2013-10-06 DIAGNOSIS — W06XXXA Fall from bed, initial encounter: Secondary | ICD-10-CM | POA: Insufficient documentation

## 2013-10-06 DIAGNOSIS — F3289 Other specified depressive episodes: Secondary | ICD-10-CM | POA: Insufficient documentation

## 2013-10-06 DIAGNOSIS — Y92009 Unspecified place in unspecified non-institutional (private) residence as the place of occurrence of the external cause: Secondary | ICD-10-CM | POA: Insufficient documentation

## 2013-10-06 DIAGNOSIS — F329 Major depressive disorder, single episode, unspecified: Secondary | ICD-10-CM | POA: Insufficient documentation

## 2013-10-06 DIAGNOSIS — S42002A Fracture of unspecified part of left clavicle, initial encounter for closed fracture: Secondary | ICD-10-CM

## 2013-10-06 DIAGNOSIS — S42033A Displaced fracture of lateral end of unspecified clavicle, initial encounter for closed fracture: Principal | ICD-10-CM | POA: Insufficient documentation

## 2013-10-06 LAB — CBC WITH DIFFERENTIAL/PLATELET
Eosinophils Absolute: 0.2 10*3/uL (ref 0.0–0.7)
HCT: 40.4 % (ref 36.0–46.0)
Hemoglobin: 14.2 g/dL (ref 12.0–15.0)
Lymphs Abs: 2.3 10*3/uL (ref 0.7–4.0)
MCH: 32.3 pg (ref 26.0–34.0)
MCHC: 35.1 g/dL (ref 30.0–36.0)
Monocytes Absolute: 1.1 10*3/uL — ABNORMAL HIGH (ref 0.1–1.0)
Monocytes Relative: 9 % (ref 3–12)
Neutro Abs: 8.9 10*3/uL — ABNORMAL HIGH (ref 1.7–7.7)
Neutrophils Relative %: 71 % (ref 43–77)
RBC: 4.39 MIL/uL (ref 3.87–5.11)
RDW: 15.1 % (ref 11.5–15.5)

## 2013-10-06 LAB — TROPONIN I: Troponin I: <0.3 ng/mL (ref <0.30)

## 2013-10-06 LAB — COMPREHENSIVE METABOLIC PANEL
Alkaline Phosphatase: 66 U/L (ref 39–117)
BUN: 21 mg/dL (ref 6–23)
Chloride: 103 mEq/L (ref 96–112)
Creatinine, Ser: 0.96 mg/dL (ref 0.50–1.10)
GFR calc Af Amer: 59 mL/min — ABNORMAL LOW (ref >90)
Glucose, Bld: 94 mg/dL (ref 70–99)
Potassium: 3.9 mEq/L (ref 3.5–5.1)
Total Bilirubin: 0.8 mg/dL (ref 0.3–1.2)
Total Protein: 8 g/dL (ref 6.0–8.3)

## 2013-10-06 MED ORDER — ALBUTEROL SULFATE (5 MG/ML) 0.5% IN NEBU: 2.5000 mg | INHALATION_SOLUTION | RESPIRATORY_TRACT | Status: DC | PRN

## 2013-10-06 MED ORDER — HYDROCODONE-ACETAMINOPHEN 5-325 MG PO TABS
1.0000 | ORAL_TABLET | Freq: Once | ORAL | Status: AC
  Administered 2013-10-06: 1 via ORAL
  Filled 2013-10-06: qty 1

## 2013-10-06 MED ORDER — HYDROCODONE-ACETAMINOPHEN 5-325 MG PO TABS: 2.0000 | ORAL_TABLET | ORAL | Status: AC | PRN

## 2013-10-06 MED ORDER — DOCUSATE SODIUM 100 MG PO CAPS
100.0000 mg | ORAL_CAPSULE | Freq: Every day | ORAL | Status: DC
  Administered 2013-10-06 – 2013-10-07 (×2): 100 mg via ORAL
  Filled 2013-10-06 (×2): qty 1

## 2013-10-06 MED ORDER — ACETAMINOPHEN 325 MG PO TABS
650.0000 mg | ORAL_TABLET | Freq: Four times a day (QID) | ORAL | Status: DC | PRN
  Administered 2013-10-07: 650 mg via ORAL
  Filled 2013-10-06: qty 2

## 2013-10-06 MED ORDER — DILTIAZEM HCL ER COATED BEADS 120 MG PO CP24
120.0000 mg | ORAL_CAPSULE | Freq: Every day | ORAL | Status: DC
  Administered 2013-10-06 – 2013-10-07 (×2): 120 mg via ORAL
  Filled 2013-10-06 (×2): qty 1

## 2013-10-06 MED ORDER — CALCIUM CARBONATE-VITAMIN D 500-200 MG-UNIT PO TABS
1.0000 | ORAL_TABLET | Freq: Every day | ORAL | Status: DC
  Administered 2013-10-06 – 2013-10-07 (×2): 1 via ORAL
  Filled 2013-10-06 (×2): qty 1

## 2013-10-06 MED ORDER — HYDROCODONE-ACETAMINOPHEN 5-325 MG PO TABS: 1.0000 | ORAL_TABLET | Freq: Three times a day (TID) | ORAL | Status: DC | PRN

## 2013-10-06 MED ORDER — MULTI-VITAMIN/MINERALS PO TABS
1.0000 | ORAL_TABLET | Freq: Every day | ORAL | Status: DC
Start: 2013-10-06 — End: 2013-10-06

## 2013-10-06 MED ORDER — SODIUM CHLORIDE 0.9 % IV SOLN
INTRAVENOUS | Status: DC
  Administered 2013-10-06: 21:00:00 via INTRAVENOUS

## 2013-10-06 MED ORDER — CITALOPRAM HYDROBROMIDE 20 MG PO TABS
20.0000 mg | ORAL_TABLET | Freq: Every day | ORAL | Status: DC
  Administered 2013-10-06 – 2013-10-07 (×2): 20 mg via ORAL
  Filled 2013-10-06 (×2): qty 1

## 2013-10-06 MED ORDER — ADULT MULTIVITAMIN W/MINERALS CH
1.0000 | ORAL_TABLET | Freq: Every day | ORAL | Status: DC
  Administered 2013-10-07: 1 via ORAL
  Filled 2013-10-06: qty 1

## 2013-10-06 MED ORDER — ONDANSETRON HCL 4 MG/2ML IJ SOLN: 4.0000 mg | Freq: Four times a day (QID) | INTRAMUSCULAR | Status: DC | PRN

## 2013-10-06 MED ORDER — ACETAMINOPHEN 650 MG RE SUPP: 650.0000 mg | Freq: Four times a day (QID) | RECTAL | Status: DC | PRN

## 2013-10-06 MED ORDER — ACETAMINOPHEN 325 MG PO TABS
650.0000 mg | ORAL_TABLET | Freq: Once | ORAL | Status: AC
  Administered 2013-10-06: 650 mg via ORAL
  Filled 2013-10-06 (×2): qty 2

## 2013-10-06 MED ORDER — ONDANSETRON HCL 4 MG PO TABS: 4.0000 mg | ORAL_TABLET | Freq: Four times a day (QID) | ORAL | Status: DC | PRN

## 2013-10-06 NOTE — ED Notes (Signed)
Family is refusing to allow pt to be discharged from hospital. Family does not feel like pt can take care of self at independent living with nurse.

## 2013-10-06 NOTE — ED Provider Notes (Signed)
CSN: [id number]     Arrival date & time 10/06/13  1023 History   First MD Initiated Contact with Patient 10/06/13 1032     Chief Complaint  Patient presents with  . Fall   (Consider location/radiation/quality/duration/timing/severity/associated sxs/prior Treatment) HPI Comments: Patient presents via EMS after a witnessed fall. She is sitting at the side of the bed and her caregiver stepped out of the room and patient fell to the floor. Unknown loss of consciousness. She is not on anticoagulation. Her previous hemorrhagic stroke 2008. Patient has disorientation and confusion at baseline. This is unchanged. She complains of pain in her head and her neck. Her son states she has a headache every morning. She is moving all her extremities. She is mostly wheelchair bound at baseline and has a caregiver 24/7.  The history is provided by the patient and the EMS personnel. The history is limited by the condition of the patient.    Past Medical History  Diagnosis Date  . Depression   . Presbyacusia   . Peripheral neuropathy   . Hyperlipidemia   . Hearing loss   . Stroke   . 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 DVT of leg (deep venous thrombosis) 2008    ?left  . Idiopathic neuropathy   . Hypertension     "related to h/o cerebral hemorrhage 2008; dr put her on meds since that hx"  . Presence of IVC filter 2008  . Hemorrhoids, internal, with bleeding   . Cluster headaches     "during middle age"  . Chronic headaches     daily since CVA 2008  . Dementia   . Allergy   . Blood transfusion without reported diagnosis   . Arthritis    Past Surgical History  Procedure Laterality Date  . Vena cava filter placement  2008    "for DVT"  . Tonsillectomy and adenoidectomy      "when I was a kid"   Family History  Problem Relation Age of Onset  . Diabetes Mother    History  Substance Use Topics  . Smoking status: Never Smoker    . Smokeless tobacco: Never Used  . Alcohol Use: No   OB History   Grav Para Term Preterm Abortions TAB SAB Ect Mult Living                 Review of Systems  Unable to perform ROS: Dementia    Allergies  Review of patient's allergies indicates no known allergies.  Home Medications   Current Outpatient Rx  Name  Route  Sig  Dispense  Refill  . HYDROcodone-acetaminophen (NORCO/VICODIN) 5-325 MG per tablet   Oral   Take 2 tablets by mouth every 4 (four) hours as needed.   10 tablet   0    BP 139/73  Pulse 81  Temp(Src) 97.7 F (36.5 C) (Oral)  Resp 16  Ht 5\' 7"  (1.702 m)  Wt 154 lb 11.2 oz (70.171 kg)  BMI 24.22 kg/m2  SpO2 96% Physical Exam  Constitutional: She appears well-developed and well-nourished. No distress.  HENT:  Head: Normocephalic and atraumatic.  Mouth/Throat: Oropharynx is clear and moist. No oropharyngeal exudate.   Tender to palpation in the posterior scalp  Eyes: Conjunctivae and EOM are normal. Pupils are equal, round, and reactive to light.  Neck: Normal range of motion. Neck supple.  No C-spine step-off or deformity  Cardiovascular: Normal rate, regular rhythm  and normal heart sounds.   No murmur heard. Pulmonary/Chest: Effort normal and breath sounds normal. No respiratory distress.  Abdominal: Soft. There is no tenderness. There is no rebound and no guarding.  Musculoskeletal: Normal range of motion. She exhibits tenderness. She exhibits no edema.  FROM hips without pain TTP L anterior shoulder with reduced ROM Intact radial pulse No T or L spine tenderness  Neurological: She is alert. No cranial nerve deficit. She exhibits normal muscle tone. Coordination normal.  Oriented to self. Moving all extremities equally  Skin: Skin is warm.    ED Course  Procedures (including critical care time) Labs Review Labs Reviewed  CBC WITH DIFFERENTIAL - Abnormal; Notable for the following:    WBC 12.4 (*)    Neutro Abs 8.9 (*)    Monocytes  Absolute 1.1 (*)    All other components within normal limits  COMPREHENSIVE METABOLIC PANEL - Abnormal; Notable for the following:    GFR calc non Af Amer 51 (*)    GFR calc Af Amer 59 (*)    All other components within normal limits  TROPONIN I   Imaging Review Dg Chest 1 View  10/06/2013   CLINICAL DATA:  Pain post trauma  EXAM: CHEST - 1 VIEW  COMPARISON:  November 25, 2011  FINDINGS: There is no edema or consolidation. Heart is mildly enlarged with normal pulmonary vascularity. No pneumothorax. No adenopathy. There is atherosclerotic change in aorta. No apparent bone lesions.  IMPRESSION: Mild cardiac enlargement. No edema or consolidation. No pneumothorax.   Electronically Signed   By: Bretta Bang M.D.   On: 10/06/2013 14:31   Dg Lumbar Spine Complete  10/06/2013   CLINICAL DATA:  Pain post trauma  EXAM: LUMBAR SPINE - COMPLETE 4+ VIEW  COMPARISON:  None.  FINDINGS: Frontal, lateral, spot lumbosacral lateral, and bilateral oblique views were obtained. There are 5 non-rib-bearing lumbar type vertebral bodies. There is dextroscoliosis. There is slight anterior wedging of the T12 vertebral body, age uncertain. There is no apparent lumbar fracture or spondylolisthesis. There is moderate disc space narrowing at L2-3, L4-5, and L5-S1. There is facet osteoarthritic change at L4-5 and L5-S1 bilaterally. There is a filter in the inferior vena cava.  IMPRESSION: Scoliosis and osteoarthritic change. Mild age uncertain anterior wedging of the T12 vertebral body. No lumbar fracture. No spondylolisthesis. Filter in the inferior vena cava with the apex of the filter at the level of L2.   Electronically Signed   By: Bretta Bang M.D.   On: 10/06/2013 14:25   Dg Pelvis 1-2 Views  10/06/2013   CLINICAL DATA:  Fall  EXAM: PELVIS - 1-2 VIEW  COMPARISON:  None.  FINDINGS: No acute fracture.  No dislocation.  No osteopenia.  IMPRESSION: No acute bony pathology.   Electronically Signed   By: Maryclare Bean  M.D.   On: 10/06/2013 11:34   Ct Head Wo Contrast  10/06/2013   CLINICAL DATA:  Fall  EXAM: CT HEAD WITHOUT CONTRAST  CT CERVICAL SPINE WITHOUT CONTRAST  TECHNIQUE: Multidetector CT imaging of the head and cervical spine was performed following the standard protocol without intravenous contrast. Multiplanar CT image reconstructions of the cervical spine were also generated.  COMPARISON:  None.  FINDINGS: CT HEAD FINDINGS  There is diffuse low attenuation within the subcortical and periventricular white matter consistent with small vessel ischemic disease and brain atrophy. Prominence of the sulci and ventricles noted compatible with brain atrophy. No midline shift, ventriculomegaly, mass effect, evidence of  mass lesion, intracranial hemorrhage or evidence of cortically based acute infarction. Gray-white matter differentiation is within normal limits throughout the brain. The paranasal sinuses are clear. The mastoid air cells are clear. The skull appears intact.  CT CERVICAL SPINE FINDINGS  The bones are diffusely osteopenic. Normal alignment of the cervical spine. The vertebral body heights are well preserved. Multi level disc space narrowing and ventral endplate spurring is identified consistent with degenerative disc disease. No fractures or subluxations identified.  IMPRESSION: CT head:  1. No acute findings. 2. Small vessel ischemic disease and brain atrophy. CT cervical spine:  1. No acute findings. 2. Cervical spondylosis.   Electronically Signed   By: Signa Kell M.D.   On: 10/06/2013 12:49   Ct Chest Wo Contrast  10/06/2013   CLINICAL DATA:  Left shoulder pain  EXAM: CT CHEST WITHOUT CONTRAST  TECHNIQUE: Multidetector CT imaging of the chest was performed following the standard protocol without IV contrast.  COMPARISON:  08/30/2007  FINDINGS: No pleural effusions identified. There is bilateral, basilar predominant interstitial reticulation, bronchiectasis and mild subpleural honeycombing  identified. This appears slightly progressive when compared with the previous exam. No airspace consolidation identified. No pulmonary parenchymal nodule or mass identified.  Trachea appears patent and is midline. The heart size is moderately enlarged. Calcified atherosclerotic disease involves the thoracic aorta. Calcifications also involve the LAD and left circumflex coronary artery as well as the mitral valve. Calcified sub- carinal lymph node is noted. No mediastinal or hilar adenopathy identified. The patient has a moderate size hiatal hernia.  No axillary or supraclavicular adenopathy identified. Nodule arising from the inferior pole of the left lobe of thyroid gland measures 1.2 cm, image 19/series 3. Incidental imaging through the upper abdomen shows a cyst within the left hepatic lobe. No acute findings identified within the upper abdomen. Gallstones are identified.  Review of the visualized osseous structures shows an acute fracture involving the distal aspect of the left clavicle, image 8/series 3. There is multilevel spondylosis noted throughout the cervical and thoracic spine.  IMPRESSION: 1. Acute fracture involves the distal aspect of the left clavicle. 2. Chronic interstitial lung disease compatible with usual interstitial pneumonitis. 3. Coronary artery calcified atherosclerotic disease. 4. Hiatal hernia.   Electronically Signed   By: Signa Kell M.D.   On: 10/06/2013 14:38   Ct Cervical Spine Wo Contrast  10/06/2013   CLINICAL DATA:  Fall  EXAM: CT HEAD WITHOUT CONTRAST  CT CERVICAL SPINE WITHOUT CONTRAST  TECHNIQUE: Multidetector CT imaging of the head and cervical spine was performed following the standard protocol without intravenous contrast. Multiplanar CT image reconstructions of the cervical spine were also generated.  COMPARISON:  None.  FINDINGS: CT HEAD FINDINGS  There is diffuse low attenuation within the subcortical and periventricular white matter consistent with small vessel  ischemic disease and brain atrophy. Prominence of the sulci and ventricles noted compatible with brain atrophy. No midline shift, ventriculomegaly, mass effect, evidence of mass lesion, intracranial hemorrhage or evidence of cortically based acute infarction. Gray-white matter differentiation is within normal limits throughout the brain. The paranasal sinuses are clear. The mastoid air cells are clear. The skull appears intact.  CT CERVICAL SPINE FINDINGS  The bones are diffusely osteopenic. Normal alignment of the cervical spine. The vertebral body heights are well preserved. Multi level disc space narrowing and ventral endplate spurring is identified consistent with degenerative disc disease. No fractures or subluxations identified.  IMPRESSION: CT head:  1. No acute findings. 2. Small vessel  ischemic disease and brain atrophy. CT cervical spine:  1. No acute findings. 2. Cervical spondylosis.   Electronically Signed   By: Signa Kell M.D.   On: 10/06/2013 12:49   Ct Pelvis Wo Contrast  10/06/2013   CLINICAL DATA:  A fall, patient found on the floor be site her dead. Pelvic pain.  EXAM: CT PELVIS WITHOUT CONTRAST  TECHNIQUE: Multidetector CT imaging of the pelvis was performed following the standard protocol without intravenous contrast.  COMPARISON:  No prior CT.  AP pelvis x-ray earlier same date.  FINDINGS: No evidence of acute fracture involving the bony pelvis or either proximal femur. Mild generalized osseous demineralization. Degenerative changes in both hips, with a large subchondral cyst/geode in the anterior column of the right acetabulum. Sacroiliac joints and symphysis pubis intact with mild degenerative changes. Facet degenerative changes at L4-5 and L5-S1, with relatively well preserved disc spaces at these levels.  Soft tissue window images demonstrate severe aortoiliac atherosclerosis without aneurysm, a small periumbilical hernia containing fat, and sigmoid colon diverticulosis without  evidence of acute diverticulitis.  IMPRESSION: 1. No evidence of acute fracture involving the bony pelvis or either proximal femur. 2. Degenerative changes involving the hip joints bilaterally, right greater than left. 3. Findings in the anatomic pelvis as detailed above, none of which are acute.   Electronically Signed   By: Hulan Saas M.D.   On: 10/06/2013 17:24   Ct Shoulder Left Wo Contrast  10/06/2013   CLINICAL DATA:  Left shoulder pain, fall  EXAM: CT OF THE LEFT SHOULDER WITHOUT CONTRAST  TECHNIQUE: Multidetector CT imaging was performed according to the standard protocol. Multiplanar CT image reconstructions were also generated.  COMPARISON:  10/06/2013  FINDINGS: Minimally displaced left distal clavicle fracture noted by CT. Bones are osteopenic. No associated AC joint separation. Mild degenerative changes of the left AC joint. Glenohumeral joint aligned. Scapula and the visualized humerus appear intact. Visualized left ribs demonstrate no displaced fracture. Left clavicle region soft tissue swelling above the fracture noted.  IMPRESSION: Acute minimally displaced left distal clavicle fracture   Electronically Signed   By: Ruel Favors M.D.   On: 10/06/2013 14:31   Dg Shoulder Left  10/06/2013   ADDENDUM REPORT: 10/06/2013 14:32  ADDENDUM: By CT comparison, there is a left distal clavicle minimally displaced acute fracture evident. No associated AC joint separation.   Electronically Signed   By: Ruel Favors M.D.   On: 10/06/2013 14:32   10/06/2013   CLINICAL DATA:  Fall, pain  EXAM: LEFT SHOULDER - 2+ VIEW  COMPARISON:  None.  FINDINGS: Limited exam because of positioning. No gross malalignment or fracture. AC joint degenerative change present. Bones are osteopenic. Visualized left ribs intact. Thoracic aortic atherosclerosis present.  IMPRESSION: No acute osseous finding.  Electronically Signed: By: Ruel Favors M.D. On: 10/06/2013 11:35   Dg Humerus Left  10/06/2013   CLINICAL  DATA:  Fall, shoulder pain  EXAM: LEFT HUMERUS - 2+ VIEW  COMPARISON:  10/06/2013  FINDINGS: Left humerus intact. Normal alignment. Left clavicle region soft tissue swelling. Lucency of the distal clavicle noted suspicious for a subtle fracture. No associated AC joint separation.  IMPRESSION: Intact left humerus.  Suspect left distal clavicle nondisplaced fracture with overlying soft tissue swelling   Electronically Signed   By: Ruel Favors M.D.   On: 10/06/2013 14:27    EKG Interpretation    Date/Time:    Ventricular Rate:    PR Interval:    QRS Duration:  QT Interval:    QTC Calculation:   R Axis:     Text Interpretation:              MDM   1. Fall, initial encounter   2. Clavicle fracture, left, closed, initial encounter   3. Dementia   4. HTN (hypertension)    Unwitnessed fall with head neck and left shoulder pain. Mental status is at baseline.  CT head and C-spine negative. X-ray of shoulder and pelvis negative. Patient very anxious and complaining of pain in her left shoulder. Patient's granddaughter is hovering over the bed and making it difficult to care for patient.  Additional imaging of shoulder and pelvis will be procured as patient is difficult to evaluate and quite anxious. L clavicle fracture noted.  Only apparent injury.  Discharge back to independent living with supervision was planned but family extremely worried that patient will be unable to do her previous activities due to her clavicle injury. hospitalist agreeable to observation admission.   Date: 10/06/2013  Rate: 70  Rhythm: normal sinus rhythm  QRS Axis: normal  Intervals: normal  ST/T Wave abnormalities: nonspecific ST/T changes  Conduction Disutrbances:none  Narrative Interpretation:   Old EKG Reviewed: unchanged    Glynn Octave, MD 10/07/13 [phone]

## 2013-10-06 NOTE — ED Notes (Signed)
IV attempts x 3 in right arm by EMS

## 2013-10-06 NOTE — ED Notes (Signed)
Pt found on floor beside of bed in her room.

## 2013-10-06 NOTE — ED Notes (Signed)
Spoke with family and explained to them that pt's bed assignment has been changed from Telemetry to med-surg and that was what we were waiting on. Pt resting comfortably.

## 2013-10-06 NOTE — ED Notes (Signed)
Pt lives at [facility] independent living

## 2013-10-06 NOTE — ED Notes (Signed)
Back board and head blocks removed by Dr. Manus Gunning. Pt family at bedside. Pt is anxious and "confused as norm" per family.  C collar changed to  Soft collar d/t pt compfort

## 2013-10-06 NOTE — ED Notes (Signed)
Patient transported to CT 

## 2013-10-06 NOTE — ED Notes (Signed)
Pt's granddaughter hovering over pt, also making pt anxious making caring for pt more difficult.

## 2013-10-06 NOTE — Progress Notes (Signed)
Orthopedic Tech Progress Note Patient Details:  Alice Wu [birth year] [id number]  Ortho Devices Type of Ortho Device: Arm sling Ortho Device/Splint Location: lue Ortho Device/Splint Interventions: Application   [name] 10/06/2013, 3:06 PM

## 2013-10-06 NOTE — ED Notes (Signed)
Flow manager called- sts will change from tele bed to med-surg bed.

## 2013-10-06 NOTE — ED Provider Notes (Signed)
Pt seen by Dr Manus Gunning.  Plan was to admit for observation after fall, possible syncopal episode.  X-rays revealed a clavicle fracture.    Pt will be admitted to a telemetry bed.  Celene Kras, MD 10/06/13 [phone]

## 2013-10-06 NOTE — H&P (Signed)
[facility]  History and Physical  Nedda W Taul GEX:[id number] DOB: [birth year] DOA: 10/06/2013  Referring physician: EDP PCP: Ginette Otto, MD  Outpatient Specialists:  1. None  Chief Complaint: Fall, left shoulder pain  HPI: Alice Wu is a 79 y.o. female with history of dementia, depression, peripheral neuropathy, hemorrhagic CVA (2008), DVT status post IVC filter, hypertension, hemorrhoids with intermittent rectal bleeding, chronic headaches, resident of independent living who has 24 hour care, wheelchair mobile at baseline, presented to the ED on 10/06/13 following a mechanical fall and left shoulder pain. Patient unable to provide any history secondary to dense dementia. History is obtained from patient's caregiver who is at the bedside. According to family and caregiver, patient is wheelchair mobile. She requires full assistance from caregiver in addition to her walker to transfer from her bed to the wheelchair. This morning at approximately 9 AM, patient was sitting at the edge of the bed waiting to get onto her wheelchair. The caregiver briefly stepped into the bathroom to get her toothbrush. Within the split moment, patient apparently tried to stand up, lost balance and when the caregiver rushed back into the room, she found the patient fallen on the carpeted floor on her left side. There was no loss of consciousness or bleeding. Patient did not complain of any chest pain or palpitations. She did complain of left-sided shoulder and neck pain. She was brought to the ED. In the ED extensive evaluation revealed left distal clavicular fracture. Shoulder sling was placed and plan was to discharge her back to the independent living facility. Family however were extremely concerned that patient requires her upper extremities to manage her activities and she may not be able to perform same due to the fracture. Hospitalist admission was requested.   Review of Systems: All systems  reviewed and apart from history of presenting illness, are negative. Patient has intermittent mild and chronic rectal bleeding from hemorrhoids.  Past Medical History  Diagnosis Date  . Depression   . Presbyacusia   . Peripheral neuropathy   . Hyperlipidemia   . Hearing loss   . Stroke   . 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 DVT of leg (deep venous thrombosis) 2008    ?left  . Idiopathic neuropathy   . Hypertension     "related to h/o cerebral hemorrhage 2008; dr put her on meds since that hx"  . Presence of IVC filter 2008  . Hemorrhoids, internal, with bleeding   . Cluster headaches     "during middle age"  . Chronic headaches     daily since CVA 2008  . Dementia   . Allergy   . Blood transfusion without reported diagnosis   . Arthritis    Past Surgical History  Procedure Laterality Date  . Vena cava filter placement  2008    "for DVT"  . Tonsillectomy and adenoidectomy      "when I was a kid"   Social History:  reports that she has never smoked. She has never used smokeless tobacco. She reports that she does not drink alcohol or use illicit drugs. Dependent on activities of daily living.  No Known Allergies  Family History  Problem Relation Age of Onset  . Diabetes Mother     Prior to Admission medications   Medication Sig Start Date End Date Taking? Authorizing Provider  acetaminophen (TYLENOL) 500 MG tablet Take 500 mg by mouth every 6 (six) hours  as needed for mild pain or fever.   Yes Historical Provider, MD  alendronate (FOSAMAX) 70 MG/75ML solution Take 37.5 mg by mouth every 7 (seven) days.  07/30/12  Yes Historical Provider, MD  calcium-vitamin D (OSCAL WITH D) 500-200 MG-UNIT per tablet Take 1 tablet by mouth daily.   Yes Historical Provider, MD  citalopram (CELEXA) 20 MG tablet Take 20 mg by mouth daily.   Yes Historical Provider, MD  Cranberry-Vitamin C-Vitamin E (CRANBERRY PLUS VITAMIN C  PO) Take 1 tablet by mouth daily.   Yes Historical Provider, MD  diltiazem (CARDIZEM CD) 120 MG 24 hr capsule  07/30/12  Yes Historical Provider, MD  docusate sodium (COLACE) 100 MG capsule Take 100 mg by mouth daily.   Yes Historical Provider, MD  Multiple Vitamins-Minerals (ICAPS AREDS FORMULA PO) Take 1 capsule by mouth 2 (two) times daily.   Yes Historical Provider, MD  Multiple Vitamins-Minerals (MULTIVITAMIN WITH MINERALS) tablet Take 1 tablet by mouth daily.   Yes Historical Provider, MD  SF 5000 PLUS 1.1 % CREA dental cream Place 1 application onto teeth daily.  10/25/11  Yes Historical Provider, MD  HYDROcodone-acetaminophen (NORCO/VICODIN) 5-325 MG per tablet Take 2 tablets by mouth every 4 (four) hours as needed. 10/06/13   Glynn Octave, MD   Physical Exam: Filed Vitals:   10/06/13 1754 10/06/13 1755 10/06/13 1800 10/06/13 1802  BP: 144/74 144/74 149/74 149/74  Pulse: 84 84 83 83  Temp:      TempSrc:      Resp:    18  SpO2: 96% 95% 96% 96%     General exam: Moderately built and nourished elderly female patient, lying comfortably supine on the gurney in no obvious distress.  Head, eyes and ENT: Nontraumatic and normocephalic. Pupils equally reacting to light and accommodation. Oral mucosa moist. Missing multiple teeth.  Neck: Supple. No JVD, carotid bruit or thyromegaly.  Lymphatics: No lymphadenopathy.  Respiratory system: Clear to auscultation anteriorly. No increased work of breathing. Unable to examine back due to the significant amount of pain in patient reluctance to turn or set up.  Cardiovascular system: S1 and S2 heard, RRR. No JVD, murmurs, gallops, clicks or pedal edema.  Gastrointestinal system: Abdomen is nondistended, soft and nontender. Normal bowel sounds heard. No organomegaly or masses appreciated.  Central nervous system: Alert and oriented only to self. No focal neurological deficits. Hard of hearing. Follows some commands.  Extremities: Symmetric 5  x 5 power except left upper extremity were movements are restricted secondary to pain. Peripheral pulses symmetrically felt. Left upper extremity immobilized in shoulder sling.  Skin: No rashes or acute findings.  Musculoskeletal system: Negative exam.  Psychiatry: Pleasant and cooperative.   Labs on Admission:  Basic Metabolic Panel:  Recent Labs Lab 10/06/13 1606  NA 139  K 3.9  CL 103  CO2 20  GLUCOSE 94  BUN 21  CREATININE 0.96  CALCIUM 9.8   Liver Function Tests:  Recent Labs Lab 10/06/13 1606  AST 20  ALT 12  ALKPHOS 66  BILITOT 0.8  PROT 8.0  ALBUMIN 3.8   No results found for this basename: LIPASE, AMYLASE,  in the last 168 hours No results found for this basename: AMMONIA,  in the last 168 hours CBC:  Recent Labs Lab 10/06/13 1606  WBC 12.4*  NEUTROABS 8.9*  HGB 14.2  HCT 40.4  MCV 92.0  PLT 277   Cardiac Enzymes:  Recent Labs Lab 10/06/13 1606  TROPONINI <0.30    BNP (last  3 results) No results found for this basename: PROBNP,  in the last 8760 hours CBG: No results found for this basename: GLUCAP,  in the last 168 hours  Radiological Exams on Admission: Dg Chest 1 View  10/06/2013   CLINICAL DATA:  Pain post trauma  EXAM: CHEST - 1 VIEW  COMPARISON:  November 25, 2011  FINDINGS: There is no edema or consolidation. Heart is mildly enlarged with normal pulmonary vascularity. No pneumothorax. No adenopathy. There is atherosclerotic change in aorta. No apparent bone lesions.  IMPRESSION: Mild cardiac enlargement. No edema or consolidation. No pneumothorax.   Electronically Signed   By: Bretta Bang M.D.   On: 10/06/2013 14:31   Dg Lumbar Spine Complete  10/06/2013   CLINICAL DATA:  Pain post trauma  EXAM: LUMBAR SPINE - COMPLETE 4+ VIEW  COMPARISON:  None.  FINDINGS: Frontal, lateral, spot lumbosacral lateral, and bilateral oblique views were obtained. There are 5 non-rib-bearing lumbar type vertebral bodies. There is dextroscoliosis.  There is slight anterior wedging of the T12 vertebral body, age uncertain. There is no apparent lumbar fracture or spondylolisthesis. There is moderate disc space narrowing at L2-3, L4-5, and L5-S1. There is facet osteoarthritic change at L4-5 and L5-S1 bilaterally. There is a filter in the inferior vena cava.  IMPRESSION: Scoliosis and osteoarthritic change. Mild age uncertain anterior wedging of the T12 vertebral body. No lumbar fracture. No spondylolisthesis. Filter in the inferior vena cava with the apex of the filter at the level of L2.   Electronically Signed   By: Bretta Bang M.D.   On: 10/06/2013 14:25   Dg Pelvis 1-2 Views  10/06/2013   CLINICAL DATA:  Fall  EXAM: PELVIS - 1-2 VIEW  COMPARISON:  None.  FINDINGS: No acute fracture.  No dislocation.  No osteopenia.  IMPRESSION: No acute bony pathology.   Electronically Signed   By: Maryclare Bean M.D.   On: 10/06/2013 11:34   Ct Head Wo Contrast  10/06/2013   CLINICAL DATA:  Fall  EXAM: CT HEAD WITHOUT CONTRAST  CT CERVICAL SPINE WITHOUT CONTRAST  TECHNIQUE: Multidetector CT imaging of the head and cervical spine was performed following the standard protocol without intravenous contrast. Multiplanar CT image reconstructions of the cervical spine were also generated.  COMPARISON:  None.  FINDINGS: CT HEAD FINDINGS  There is diffuse low attenuation within the subcortical and periventricular white matter consistent with small vessel ischemic disease and brain atrophy. Prominence of the sulci and ventricles noted compatible with brain atrophy. No midline shift, ventriculomegaly, mass effect, evidence of mass lesion, intracranial hemorrhage or evidence of cortically based acute infarction. Gray-white matter differentiation is within normal limits throughout the brain. The paranasal sinuses are clear. The mastoid air cells are clear. The skull appears intact.  CT CERVICAL SPINE FINDINGS  The bones are diffusely osteopenic. Normal alignment of the cervical  spine. The vertebral body heights are well preserved. Multi level disc space narrowing and ventral endplate spurring is identified consistent with degenerative disc disease. No fractures or subluxations identified.  IMPRESSION: CT head:  1. No acute findings. 2. Small vessel ischemic disease and brain atrophy. CT cervical spine:  1. No acute findings. 2. Cervical spondylosis.   Electronically Signed   By: Signa Kell M.D.   On: 10/06/2013 12:49   Ct Chest Wo Contrast  10/06/2013   CLINICAL DATA:  Left shoulder pain  EXAM: CT CHEST WITHOUT CONTRAST  TECHNIQUE: Multidetector CT imaging of the chest was performed following the standard protocol  without IV contrast.  COMPARISON:  08/30/2007  FINDINGS: No pleural effusions identified. There is bilateral, basilar predominant interstitial reticulation, bronchiectasis and mild subpleural honeycombing identified. This appears slightly progressive when compared with the previous exam. No airspace consolidation identified. No pulmonary parenchymal nodule or mass identified.  Trachea appears patent and is midline. The heart size is moderately enlarged. Calcified atherosclerotic disease involves the thoracic aorta. Calcifications also involve the LAD and left circumflex coronary artery as well as the mitral valve. Calcified sub- carinal lymph node is noted. No mediastinal or hilar adenopathy identified. The patient has a moderate size hiatal hernia.  No axillary or supraclavicular adenopathy identified. Nodule arising from the inferior pole of the left lobe of thyroid gland measures 1.2 cm, image 19/series 3. Incidental imaging through the upper abdomen shows a cyst within the left hepatic lobe. No acute findings identified within the upper abdomen. Gallstones are identified.  Review of the visualized osseous structures shows an acute fracture involving the distal aspect of the left clavicle, image 8/series 3. There is multilevel spondylosis noted throughout the cervical  and thoracic spine.  IMPRESSION: 1. Acute fracture involves the distal aspect of the left clavicle. 2. Chronic interstitial lung disease compatible with usual interstitial pneumonitis. 3. Coronary artery calcified atherosclerotic disease. 4. Hiatal hernia.   Electronically Signed   By: Signa Kell M.D.   On: 10/06/2013 14:38   Ct Cervical Spine Wo Contrast  10/06/2013   CLINICAL DATA:  Fall  EXAM: CT HEAD WITHOUT CONTRAST  CT CERVICAL SPINE WITHOUT CONTRAST  TECHNIQUE: Multidetector CT imaging of the head and cervical spine was performed following the standard protocol without intravenous contrast. Multiplanar CT image reconstructions of the cervical spine were also generated.  COMPARISON:  None.  FINDINGS: CT HEAD FINDINGS  There is diffuse low attenuation within the subcortical and periventricular white matter consistent with small vessel ischemic disease and brain atrophy. Prominence of the sulci and ventricles noted compatible with brain atrophy. No midline shift, ventriculomegaly, mass effect, evidence of mass lesion, intracranial hemorrhage or evidence of cortically based acute infarction. Gray-white matter differentiation is within normal limits throughout the brain. The paranasal sinuses are clear. The mastoid air cells are clear. The skull appears intact.  CT CERVICAL SPINE FINDINGS  The bones are diffusely osteopenic. Normal alignment of the cervical spine. The vertebral body heights are well preserved. Multi level disc space narrowing and ventral endplate spurring is identified consistent with degenerative disc disease. No fractures or subluxations identified.  IMPRESSION: CT head:  1. No acute findings. 2. Small vessel ischemic disease and brain atrophy. CT cervical spine:  1. No acute findings. 2. Cervical spondylosis.   Electronically Signed   By: Signa Kell M.D.   On: 10/06/2013 12:49   Ct Pelvis Wo Contrast  10/06/2013   CLINICAL DATA:  A fall, patient found on the floor be site her  dead. Pelvic pain.  EXAM: CT PELVIS WITHOUT CONTRAST  TECHNIQUE: Multidetector CT imaging of the pelvis was performed following the standard protocol without intravenous contrast.  COMPARISON:  No prior CT.  AP pelvis x-ray earlier same date.  FINDINGS: No evidence of acute fracture involving the bony pelvis or either proximal femur. Mild generalized osseous demineralization. Degenerative changes in both hips, with a large subchondral cyst/geode in the anterior column of the right acetabulum. Sacroiliac joints and symphysis pubis intact with mild degenerative changes. Facet degenerative changes at L4-5 and L5-S1, with relatively well preserved disc spaces at these levels.  Soft tissue window  images demonstrate severe aortoiliac atherosclerosis without aneurysm, a small periumbilical hernia containing fat, and sigmoid colon diverticulosis without evidence of acute diverticulitis.  IMPRESSION: 1. No evidence of acute fracture involving the bony pelvis or either proximal femur. 2. Degenerative changes involving the hip joints bilaterally, right greater than left. 3. Findings in the anatomic pelvis as detailed above, none of which are acute.   Electronically Signed   By: Hulan Saas M.D.   On: 10/06/2013 17:24   Ct Shoulder Left Wo Contrast  10/06/2013   CLINICAL DATA:  Left shoulder pain, fall  EXAM: CT OF THE LEFT SHOULDER WITHOUT CONTRAST  TECHNIQUE: Multidetector CT imaging was performed according to the standard protocol. Multiplanar CT image reconstructions were also generated.  COMPARISON:  10/06/2013  FINDINGS: Minimally displaced left distal clavicle fracture noted by CT. Bones are osteopenic. No associated AC joint separation. Mild degenerative changes of the left AC joint. Glenohumeral joint aligned. Scapula and the visualized humerus appear intact. Visualized left ribs demonstrate no displaced fracture. Left clavicle region soft tissue swelling above the fracture noted.  IMPRESSION: Acute minimally  displaced left distal clavicle fracture   Electronically Signed   By: Ruel Favors M.D.   On: 10/06/2013 14:31   Dg Shoulder Left  10/06/2013   ADDENDUM REPORT: 10/06/2013 14:32  ADDENDUM: By CT comparison, there is a left distal clavicle minimally displaced acute fracture evident. No associated AC joint separation.   Electronically Signed   By: Ruel Favors M.D.   On: 10/06/2013 14:32   10/06/2013   CLINICAL DATA:  Fall, pain  EXAM: LEFT SHOULDER - 2+ VIEW  COMPARISON:  None.  FINDINGS: Limited exam because of positioning. No gross malalignment or fracture. AC joint degenerative change present. Bones are osteopenic. Visualized left ribs intact. Thoracic aortic atherosclerosis present.  IMPRESSION: No acute osseous finding.  Electronically Signed: By: Ruel Favors M.D. On: 10/06/2013 11:35   Dg Humerus Left  10/06/2013   CLINICAL DATA:  Fall, shoulder pain  EXAM: LEFT HUMERUS - 2+ VIEW  COMPARISON:  10/06/2013  FINDINGS: Left humerus intact. Normal alignment. Left clavicle region soft tissue swelling. Lucency of the distal clavicle noted suspicious for a subtle fracture. No associated AC joint separation.  IMPRESSION: Intact left humerus.  Suspect left distal clavicle nondisplaced fracture with overlying soft tissue swelling   Electronically Signed   By: Ruel Favors M.D.   On: 10/06/2013 14:27    Assessment/Plan Principal Problem:   Fracture of clavicle, left, closed Active Problems:   HTN (hypertension)   Dementia   Clavicle fracture   Acute minimally displaced left distal clavicle fracture, sustained status post mechanical fall - Admit to medical floor observation and evaluation. - Continue left shoulder sling. - Pain management with as little opioids as needed to keep the patient comfortable - PT and OT evaluation: Family concerned that she may have difficulties managing activities that she did PTA with her 24/7 care givers. - Consider discussing/consulting orthopedics 12/26 AM.  This fracture is most likely managed non-operatively.  Advanced dementia - Mental status at baseline.  Hypertension - Continue home medications. Controlled  History of hemorrhoids and intermittent rectal bleeding - No significant bleeds recently. Continues stool softeners    Code Status: DO NOT RESUSCITATE-confirmed with patient's son/health care power of attorney at bedside.   Family Communication: Discussed with patient's son and extended family at bedside and caregiver.   Disposition Plan: To be determined    Time spent: 55 minutes   Tipton Ballow, MD, FACP,  FHM. [facility] Pager [phone]  If 7PM-7AM, please contact night-coverage [email or web address] Password [facility] 10/06/2013, 6:56 PM

## 2013-10-06 NOTE — ED Notes (Signed)
Discharge and follow up instructions reviewed with POA. POA verbalized understanding.

## 2013-10-07 DIAGNOSIS — W19XXXA Unspecified fall, initial encounter: Secondary | ICD-10-CM

## 2013-10-07 MED ORDER — HYDROCORTISONE 2.5 % RE CREA
TOPICAL_CREAM | Freq: Four times a day (QID) | RECTAL | Status: DC | PRN
  Filled 2013-10-07: qty 28.35

## 2013-10-07 MED ORDER — HYDROCORTISONE ACE-PRAMOXINE 2.5-1 % RE CREA
TOPICAL_CREAM | Freq: Four times a day (QID) | RECTAL | Status: DC | PRN
  Filled 2013-10-07: qty 30

## 2013-10-07 MED ORDER — HYDROCORTISONE 2.5 % RE CREA: TOPICAL_CREAM | Freq: Four times a day (QID) | RECTAL | Status: AC | PRN

## 2013-10-07 NOTE — Evaluation (Signed)
Occupational Therapy Evaluation Patient Details Name: Alice Wu MRN: [id number] DOB: 01-Mar-1921 Today's Date: 10/07/2013 Time: 4098-1191 OT Time Calculation (min): 45 min  OT Assessment / Plan / Recommendation History of present illness Alice Wu is a 79 y.o. female with history of dementia, depression, peripheral neuropathy, hemorrhagic CVA (2008), DVT status post IVC filter, hypertension, hemorrhoids with intermittent rectal bleeding, chronic headaches, resident of independent living who has 24 hour care, wheelchair mobile at baseline, presented to the ED on 10/06/13 following a mechanical fall and left shoulder pain. Patient unable to provide any history secondary to dense dementia. History is obtained from patient's caregiver who is at the bedside. According to family and caregiver, patient is wheelchair mobile. She requires full assistance from caregiver in addition to her walker to transfer from her bed to the wheelchair. This morning at approximately 9 AM, patient was sitting at the edge of the bed waiting to get onto her wheelchair. The caregiver briefly stepped into the bathroom to get her toothbrush. Within the split moment, patient apparently tried to stand up, lost balance and when the caregiver rushed back into the room, she found the patient fallen on the carpeted floor on her left side. There was no loss of consciousness or bleeding. Patient did not complain of any chest pain or palpitations. She did complain of left-sided shoulder and neck pain. She was brought to the ED. In the ED extensive evaluation revealed left distal clavicular fracture   Clinical Impression   Pt significantly limited by pain this visit. Worked on education with family for L wrist and digit ROM, proper positioning for comfort/prevent edema, sling wear, benefits of OOB. Will benefit from continued OT services to maximize ADL independence/decrease caregiver burden for next venue of care.    OT Assessment   Patient needs continued OT Services    Follow Up Recommendations  SNF;Supervision/Assistance - 24 hour    Barriers to Discharge      Equipment Recommendations  3 in 1 bedside comode    Recommendations for Other Services    Frequency  Min 2X/week    Precautions / Restrictions Precautions Precautions: Fall Precaution Comments: L clavicle fracture.  Required Braces or Orthoses: Sling (for comfort) Restrictions Weight Bearing Restrictions: No Other Position/Activity Restrictions: WBAT L UE   Pertinent Vitals/Pain Pt groaning with pain when moved. Reposition L UE on pillow. Sling wear. Nursing gave meds at start of session for pain.   ADL  Eating/Feeding: Moderate assistance (due to pain) Where Assessed - Eating/Feeding: Chair Grooming: Simulated;Wash/dry hands;Wash/dry face;Moderate assistance Where Assessed - Grooming: Supported sitting Upper Body Bathing: Simulated;Chest;Right arm;Left arm;Abdomen;+1 Total assistance Where Assessed - Upper Body Bathing: Unsupported sitting Lower Body Bathing: Simulated;+2 Total assistance Lower Body Bathing: Patient Percentage: 10% Where Assessed - Lower Body Bathing: Supported sit to stand Upper Body Dressing: Simulated;+1 Total assistance Where Assessed - Upper Body Dressing: Unsupported sitting Lower Body Dressing: Simulated;+2 Total assistance Lower Body Dressing: Patient Percentage: 0% Where Assessed - Lower Body Dressing: Supported sit to stand Toileting - Architect and Hygiene: Simulated;+2 Total assistance Toileting - Architect and Hygiene: Patient Percentage: 0% ADL Comments: See PT note for pivot transfers status to chair. Once in chair, assisted with repositioning of pt in chair as she tends to lean heavily to the right. Placed pillows in chair on the R to help with keeping pt more in midline. Worked on ROM of L digits and wrist X 10 and educated family on how to help with ROM exercises.  Adjusted sling for  support and placed pillow under L UE in chair. Pt requires +2 person for all standing ADL with one person helping with standing support and second person for actual performance of hygiene, etc.     OT Diagnosis: Generalized weakness;Acute pain  OT Problem List: Decreased strength;Decreased range of motion;Decreased knowledge of use of DME or AE;Pain OT Treatment Interventions: Self-care/ADL training;DME and/or AE instruction;Therapeutic activities;Patient/family education   OT Goals(Current goals can be found in the care plan section) Acute Rehab OT Goals Patient Stated Goal: none stated OT Goal Formulation: With family Time For Goal Achievement: 10/21/13 Potential to Achieve Goals: Good  Visit Information  Last OT Received On: 10/07/13 Assistance Needed: +2 (for safety) PT/OT/SLP Co-Evaluation/Treatment: Yes Reason for Co-Treatment: Complexity of the patient's impairments (multi-system involvement) OT goals addressed during session: ADL's and self-care;Proper use of Adaptive equipment and DME;Strengthening/ROM History of Present Illness: Alice Wu is a 79 y.o. female with history of dementia, depression, peripheral neuropathy, hemorrhagic CVA (2008), DVT status post IVC filter, hypertension, hemorrhoids with intermittent rectal bleeding, chronic headaches, resident of independent living who has 24 hour care, wheelchair mobile at baseline, presented to the ED on 10/06/13 following a mechanical fall and left shoulder pain. Patient unable to provide any history secondary to dense dementia. History is obtained from patient's caregiver who is at the bedside. According to family and caregiver, patient is wheelchair mobile. She requires full assistance from caregiver in addition to her walker to transfer from her bed to the wheelchair. This morning at approximately 9 AM, patient was sitting at the edge of the bed waiting to get onto her wheelchair. The caregiver briefly stepped into the bathroom to  get her toothbrush. Within the split moment, patient apparently tried to stand up, lost balance and when the caregiver rushed back into the room, she found the patient fallen on the carpeted floor on her left side. There was no loss of consciousness or bleeding. Patient did not complain of any chest pain or palpitations. She did complain of left-sided shoulder and neck pain. She was brought to the ED. In the ED extensive evaluation revealed left distal clavicular fracture       Prior Functioning     Home Living Family/patient expects to be discharged to:: Skilled nursing facility Prior Function Level of Independence: Needs assistance Gait / Transfers Assistance Needed: pivot transfers with walker to wheelchair. Only limited distance with walker but mostly wheelchair use ADL's / Homemaking Assistance Needed: pt requires extensive assist with all ADL and functional transfers. She has 24/7 caregivers Communication Communication: No difficulties Dominant Hand: Right         Vision/Perception     Cognition  Cognition Arousal/Alertness: Awake/alert Behavior During Therapy: WFL for tasks assessed/performed Overall Cognitive Status: History of cognitive impairments - at baseline    Extremity/Trunk Assessment Upper Extremity Assessment Upper Extremity Assessment: LUE deficits/detail LUE: Unable to fully assess due to pain;Unable to fully assess due to immobilization     Mobility       Exercise Other Exercises Other Exercises: X 10 AAROM wrist and digit ROM L hand   Balance Balance Balance Assessed: Yes Static Sitting Balance Static Sitting - Balance Support: Right upper extremity supported Static Sitting - Level of Assistance: 3: Mod assist;2: Max assist   End of Session OT - End of Session Equipment Utilized During Treatment: Gait belt Activity Tolerance: Patient limited by pain Patient left: in chair;with call bell/phone within reach;with family/visitor present   GO Functional  Assessment Tool Used: clinical judgement Functional Limitation: Self care Self Care Current Status [phone]): At least 80 percent but less than 100 percent impaired, limited or restricted Self Care Goal Status ([id number]): At least 40 percent but less than 60 percent impaired, limited or restricted   Lennox Laity [phone] 10/07/2013, 12:21 PM

## 2013-10-07 NOTE — Discharge Summary (Signed)
Physician Discharge Summary  [name] NWG:[id number] DOB: 1921/03/12 DOA: 10/06/2013  PCP: Ginette Otto, MD  Admit date: 10/06/2013 Discharge date: 10/07/2013  Time spent: 35 minutes.   Recommendations for Outpatient Follow-up:  1. Follow up with Dr Charlann Boxer in 4 weeks.   Discharge Diagnoses:    Fracture of clavicle, left, closed   HTN (hypertension)   Dementia   Clavicle fracture   Discharge Condition: Stable  Diet recommendation: Heart Healthy  Filed Weights   10/06/13 2030  Weight: 70.171 kg (154 lb 11.2 oz)    History of present illness:  Alice Wu is a 79 y.o. female with history of dementia, depression, peripheral neuropathy, hemorrhagic CVA (2008), DVT status post IVC filter, hypertension, hemorrhoids with intermittent rectal bleeding, chronic headaches, resident of independent living who has 24 hour care, wheelchair mobile at baseline, presented to the ED on 10/06/13 following a mechanical fall and left shoulder pain. Patient unable to provide any history secondary to dense dementia. History is obtained from patient's caregiver who is at the bedside. According to family and caregiver, patient is wheelchair mobile. She requires full assistance from caregiver in addition to her walker to transfer from her bed to the wheelchair. This morning at approximately 9 AM, patient was sitting at the edge of the bed waiting to get onto her wheelchair. The caregiver briefly stepped into the bathroom to get her toothbrush. Within the split moment, patient apparently tried to stand up, lost balance and when the caregiver rushed back into the room, she found the patient fallen on the carpeted floor on her left side. There was no loss of consciousness or bleeding. Patient did not complain of any chest pain or palpitations. She did complain of left-sided shoulder and neck pain. She was brought to the ED. In the ED extensive evaluation revealed left distal clavicular fracture. Shoulder  sling was placed and plan was to discharge her back to the independent living facility. Family however were extremely concerned that patient requires her upper extremities to manage her activities and she may not be able to perform same due to the fracture. Hospitalist admission was requested.   Hospital Course:  Acute minimally displaced left distal clavicle fracture, sustained status post mechanical fall  - Continue left shoulder sling.  - Pain management with tylenol as needed.  - This fracture is non-operatively. Discussed care with Dr Charlann Boxer. Patient to wear sling. Weight bearing as tolerated.  -PT recommend SNF.   Advanced dementia  - Mental status at baseline.  Hypertension  - Continue home medications. Controlled  History of hemorrhoids and intermittent rectal bleeding  - No significant bleeds recently. Continues stool softeners.      Procedures:  none  Consultations:  Phone, consultation with Dr Charlann Boxer.   Discharge Exam: Filed Vitals:   10/07/13 0603  BP: 139/73  Pulse: 81  Temp: 97.7 F (36.5 C)  Resp: 16    General: no distress. Cardiovascular: S 1 S 2 RRR Respiratory: CTA  Discharge Instructions  Discharge Orders   Future Orders Complete By Expires   Diet - low sodium heart healthy  As directed    Increase activity slowly  As directed        Medication List         acetaminophen 500 MG tablet  Commonly known as:  TYLENOL  Take 500 mg by mouth every 6 (six) hours as needed for mild pain or fever.     alendronate 70 MG/75ML solution  Commonly known as:  FOSAMAX  Take 37.5 mg by mouth every 7 (seven) days.     calcium-vitamin D 500-200 MG-UNIT per tablet  Commonly known as:  OSCAL WITH D  Take 1 tablet by mouth daily.     citalopram 20 MG tablet  Commonly known as:  CELEXA  Take 20 mg by mouth daily.     CRANBERRY PLUS VITAMIN C PO  Take 1 tablet by mouth daily.     diltiazem 120 MG 24 hr capsule  Commonly known as:  CARDIZEM CD      docusate sodium 100 MG capsule  Commonly known as:  COLACE  Take 100 mg by mouth daily.     HYDROcodone-acetaminophen 5-325 MG per tablet  Commonly known as:  NORCO/VICODIN  Take 2 tablets by mouth every 4 (four) hours as needed.     hydrocortisone 2.5 % rectal cream  Commonly known as:  ANUSOL-HC  Place rectally 4 (four) times daily as needed for hemorrhoids or itching.     multivitamin with minerals tablet  Take 1 tablet by mouth daily.     ICAPS AREDS FORMULA PO  Take 1 capsule by mouth 2 (two) times daily.     SF 5000 PLUS 1.1 % Crea dental cream  Generic drug:  sodium fluoride  Place 1 application onto teeth daily.       No Known Allergies     Follow-up Information   Follow up with Ginette Otto, MD. Schedule an appointment as soon as possible for a visit in 2 days.   Specialty:  Internal Medicine   Contact information:   [address] [place] [address] [phone]       Follow up with Shelda Pal, MD. Schedule an appointment as soon as possible for a visit in 4 weeks.   Specialty:  Orthopedic Surgery   Contact information:   [address] [place] [address] [phone]        The results of significant diagnostics from this hospitalization (including imaging, microbiology, ancillary and laboratory) are listed below for reference.    Significant Diagnostic Studies: Dg Chest 1 View  10/06/2013   CLINICAL DATA:  Pain post trauma  EXAM: CHEST - 1 VIEW  COMPARISON:  November 25, 2011  FINDINGS: There is no edema or consolidation. Heart is mildly enlarged with normal pulmonary vascularity. No pneumothorax. No adenopathy. There is atherosclerotic change in aorta. No apparent bone lesions.  IMPRESSION: Mild cardiac enlargement. No edema or consolidation. No pneumothorax.   Electronically Signed   By: Bretta Bang M.D.   On: 10/06/2013 14:31   Dg Lumbar Spine Complete  10/06/2013   CLINICAL DATA:  Pain post  trauma  EXAM: LUMBAR SPINE - COMPLETE 4+ VIEW  COMPARISON:  None.  FINDINGS: Frontal, lateral, spot lumbosacral lateral, and bilateral oblique views were obtained. There are 5 non-rib-bearing lumbar type vertebral bodies. There is dextroscoliosis. There is slight anterior wedging of the T12 vertebral body, age uncertain. There is no apparent lumbar fracture or spondylolisthesis. There is moderate disc space narrowing at L2-3, L4-5, and L5-S1. There is facet osteoarthritic change at L4-5 and L5-S1 bilaterally. There is a filter in the inferior vena cava.  IMPRESSION: Scoliosis and osteoarthritic change. Mild age uncertain anterior wedging of the T12 vertebral body. No lumbar fracture. No spondylolisthesis. Filter in the inferior vena cava with the apex of the filter at the level of L2.   Electronically Signed   By: Bretta Bang M.D.   On:  10/06/2013 14:25   Dg Pelvis 1-2 Views  10/06/2013   CLINICAL DATA:  Fall  EXAM: PELVIS - 1-2 VIEW  COMPARISON:  None.  FINDINGS: No acute fracture.  No dislocation.  No osteopenia.  IMPRESSION: No acute bony pathology.   Electronically Signed   By: Maryclare Bean M.D.   On: 10/06/2013 11:34   Ct Head Wo Contrast  10/06/2013   CLINICAL DATA:  Fall  EXAM: CT HEAD WITHOUT CONTRAST  CT CERVICAL SPINE WITHOUT CONTRAST  TECHNIQUE: Multidetector CT imaging of the head and cervical spine was performed following the standard protocol without intravenous contrast. Multiplanar CT image reconstructions of the cervical spine were also generated.  COMPARISON:  None.  FINDINGS: CT HEAD FINDINGS  There is diffuse low attenuation within the subcortical and periventricular white matter consistent with small vessel ischemic disease and brain atrophy. Prominence of the sulci and ventricles noted compatible with brain atrophy. No midline shift, ventriculomegaly, mass effect, evidence of mass lesion, intracranial hemorrhage or evidence of cortically based acute infarction. Gray-white matter  differentiation is within normal limits throughout the brain. The paranasal sinuses are clear. The mastoid air cells are clear. The skull appears intact.  CT CERVICAL SPINE FINDINGS  The bones are diffusely osteopenic. Normal alignment of the cervical spine. The vertebral body heights are well preserved. Multi level disc space narrowing and ventral endplate spurring is identified consistent with degenerative disc disease. No fractures or subluxations identified.  IMPRESSION: CT head:  1. No acute findings. 2. Small vessel ischemic disease and brain atrophy. CT cervical spine:  1. No acute findings. 2. Cervical spondylosis.   Electronically Signed   By: Signa Kell M.D.   On: 10/06/2013 12:49   Ct Chest Wo Contrast  10/06/2013   CLINICAL DATA:  Left shoulder pain  EXAM: CT CHEST WITHOUT CONTRAST  TECHNIQUE: Multidetector CT imaging of the chest was performed following the standard protocol without IV contrast.  COMPARISON:  08/30/2007  FINDINGS: No pleural effusions identified. There is bilateral, basilar predominant interstitial reticulation, bronchiectasis and mild subpleural honeycombing identified. This appears slightly progressive when compared with the previous exam. No airspace consolidation identified. No pulmonary parenchymal nodule or mass identified.  Trachea appears patent and is midline. The heart size is moderately enlarged. Calcified atherosclerotic disease involves the thoracic aorta. Calcifications also involve the LAD and left circumflex coronary artery as well as the mitral valve. Calcified sub- carinal lymph node is noted. No mediastinal or hilar adenopathy identified. The patient has a moderate size hiatal hernia.  No axillary or supraclavicular adenopathy identified. Nodule arising from the inferior pole of the left lobe of thyroid gland measures 1.2 cm, image 19/series 3. Incidental imaging through the upper abdomen shows a cyst within the left hepatic lobe. No acute findings identified  within the upper abdomen. Gallstones are identified.  Review of the visualized osseous structures shows an acute fracture involving the distal aspect of the left clavicle, image 8/series 3. There is multilevel spondylosis noted throughout the cervical and thoracic spine.  IMPRESSION: 1. Acute fracture involves the distal aspect of the left clavicle. 2. Chronic interstitial lung disease compatible with usual interstitial pneumonitis. 3. Coronary artery calcified atherosclerotic disease. 4. Hiatal hernia.   Electronically Signed   By: Signa Kell M.D.   On: 10/06/2013 14:38   Ct Cervical Spine Wo Contrast  10/06/2013   CLINICAL DATA:  Fall  EXAM: CT HEAD WITHOUT CONTRAST  CT CERVICAL SPINE WITHOUT CONTRAST  TECHNIQUE: Multidetector CT imaging of the head  and cervical spine was performed following the standard protocol without intravenous contrast. Multiplanar CT image reconstructions of the cervical spine were also generated.  COMPARISON:  None.  FINDINGS: CT HEAD FINDINGS  There is diffuse low attenuation within the subcortical and periventricular white matter consistent with small vessel ischemic disease and brain atrophy. Prominence of the sulci and ventricles noted compatible with brain atrophy. No midline shift, ventriculomegaly, mass effect, evidence of mass lesion, intracranial hemorrhage or evidence of cortically based acute infarction. Gray-white matter differentiation is within normal limits throughout the brain. The paranasal sinuses are clear. The mastoid air cells are clear. The skull appears intact.  CT CERVICAL SPINE FINDINGS  The bones are diffusely osteopenic. Normal alignment of the cervical spine. The vertebral body heights are well preserved. Multi level disc space narrowing and ventral endplate spurring is identified consistent with degenerative disc disease. No fractures or subluxations identified.  IMPRESSION: CT head:  1. No acute findings. 2. Small vessel ischemic disease and brain  atrophy. CT cervical spine:  1. No acute findings. 2. Cervical spondylosis.   Electronically Signed   By: Signa Kell M.D.   On: 10/06/2013 12:49   Ct Pelvis Wo Contrast  10/06/2013   CLINICAL DATA:  A fall, patient found on the floor be site her dead. Pelvic pain.  EXAM: CT PELVIS WITHOUT CONTRAST  TECHNIQUE: Multidetector CT imaging of the pelvis was performed following the standard protocol without intravenous contrast.  COMPARISON:  No prior CT.  AP pelvis x-ray earlier same date.  FINDINGS: No evidence of acute fracture involving the bony pelvis or either proximal femur. Mild generalized osseous demineralization. Degenerative changes in both hips, with a large subchondral cyst/geode in the anterior column of the right acetabulum. Sacroiliac joints and symphysis pubis intact with mild degenerative changes. Facet degenerative changes at L4-5 and L5-S1, with relatively well preserved disc spaces at these levels.  Soft tissue window images demonstrate severe aortoiliac atherosclerosis without aneurysm, a small periumbilical hernia containing fat, and sigmoid colon diverticulosis without evidence of acute diverticulitis.  IMPRESSION: 1. No evidence of acute fracture involving the bony pelvis or either proximal femur. 2. Degenerative changes involving the hip joints bilaterally, right greater than left. 3. Findings in the anatomic pelvis as detailed above, none of which are acute.   Electronically Signed   By: Hulan Saas M.D.   On: 10/06/2013 17:24   Ct Shoulder Left Wo Contrast  10/06/2013   CLINICAL DATA:  Left shoulder pain, fall  EXAM: CT OF THE LEFT SHOULDER WITHOUT CONTRAST  TECHNIQUE: Multidetector CT imaging was performed according to the standard protocol. Multiplanar CT image reconstructions were also generated.  COMPARISON:  10/06/2013  FINDINGS: Minimally displaced left distal clavicle fracture noted by CT. Bones are osteopenic. No associated AC joint separation. Mild degenerative changes  of the left AC joint. Glenohumeral joint aligned. Scapula and the visualized humerus appear intact. Visualized left ribs demonstrate no displaced fracture. Left clavicle region soft tissue swelling above the fracture noted.  IMPRESSION: Acute minimally displaced left distal clavicle fracture   Electronically Signed   By: Ruel Favors M.D.   On: 10/06/2013 14:31   Dg Shoulder Left  10/06/2013   ADDENDUM REPORT: 10/06/2013 14:32  ADDENDUM: By CT comparison, there is a left distal clavicle minimally displaced acute fracture evident. No associated AC joint separation.   Electronically Signed   By: Ruel Favors M.D.   On: 10/06/2013 14:32   10/06/2013   CLINICAL DATA:  Fall, pain  EXAM:  LEFT SHOULDER - 2+ VIEW  COMPARISON:  None.  FINDINGS: Limited exam because of positioning. No gross malalignment or fracture. AC joint degenerative change present. Bones are osteopenic. Visualized left ribs intact. Thoracic aortic atherosclerosis present.  IMPRESSION: No acute osseous finding.  Electronically Signed: By: Ruel Favors M.D. On: 10/06/2013 11:35   Dg Humerus Left  10/06/2013   CLINICAL DATA:  Fall, shoulder pain  EXAM: LEFT HUMERUS - 2+ VIEW  COMPARISON:  10/06/2013  FINDINGS: Left humerus intact. Normal alignment. Left clavicle region soft tissue swelling. Lucency of the distal clavicle noted suspicious for a subtle fracture. No associated AC joint separation.  IMPRESSION: Intact left humerus.  Suspect left distal clavicle nondisplaced fracture with overlying soft tissue swelling   Electronically Signed   By: Ruel Favors M.D.   On: 10/06/2013 14:27    Microbiology: No results found for this or any previous visit (from the past 240 hour(s)).   Labs: Basic Metabolic Panel:  Recent Labs Lab 10/06/13 1606  NA 139  K 3.9  CL 103  CO2 20  GLUCOSE 94  BUN 21  CREATININE 0.96  CALCIUM 9.8   Liver Function Tests:  Recent Labs Lab 10/06/13 1606  AST 20  ALT 12  ALKPHOS 66  BILITOT 0.8   PROT 8.0  ALBUMIN 3.8   No results found for this basename: LIPASE, AMYLASE,  in the last 168 hours No results found for this basename: AMMONIA,  in the last 168 hours CBC:  Recent Labs Lab 10/06/13 1606  WBC 12.4*  NEUTROABS 8.9*  HGB 14.2  HCT 40.4  MCV 92.0  PLT 277   Cardiac Enzymes:  Recent Labs Lab 10/06/13 1606  TROPONINI <0.30   BNP: BNP (last 3 results) No results found for this basename: PROBNP,  in the last 8760 hours CBG: No results found for this basename: GLUCAP,  in the last 168 hours     Signed:  Jamesha Ellsworth  [facility] 10/07/2013, 12:18 PM

## 2013-10-07 NOTE — Consult Note (Signed)
Reason for Consult:left shoulder pain Referring Physician: Dr. Sunnie Nielsen  Alice Wu is an 79 y.o. female.  HPI: The patient is a 79 year old female who presented to the ED on 10/06/2013 after a fall at her home. Her son and granddaughter were present in the room with her to give the history. Apparently she had a fall at home while trying to transfer from bed to chair. Her home health aide was with her, but unfortunately had briefly turned her back. She presented to the ED where it was established that she did not have any pelvic or hip fracture. CT head showed no acute changes. Pain films of the left shoulder were inconclusive but CT of the left shoulder showed minimally displaced fracture of the distal clavicle. Due to her demented state, she was unable to supply any of the history.   Past Medical History  Diagnosis Date  . Depression   . Presbyacusia   . Peripheral neuropathy   . Hyperlipidemia   . Hearing loss   . Stroke   . 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 DVT of leg (deep venous thrombosis) 2008    ?left  . Idiopathic neuropathy   . Hypertension     "related to h/o cerebral hemorrhage 2008; dr put her on meds since that hx"  . Presence of IVC filter 2008  . Hemorrhoids, internal, with bleeding   . Cluster headaches     "during middle age"  . Chronic headaches     daily since CVA 2008  . Dementia   . Allergy   . Blood transfusion without reported diagnosis   . Arthritis     Past Surgical History  Procedure Laterality Date  . Vena cava filter placement  2008    "for DVT"  . Tonsillectomy and adenoidectomy      "when I was a kid"    Family History  Problem Relation Age of Onset  . Diabetes Mother     Social History:  reports that she has never smoked. She has never used smokeless tobacco. She reports that she does not drink alcohol or use illicit drugs.  Allergies: No Known  Allergies  Medications: I have reviewed the patient's current medications.  Results for orders placed during the hospital encounter of 10/06/13 (from the past 48 hour(s))  CBC WITH DIFFERENTIAL     Status: Abnormal   Collection Time    10/06/13  4:06 PM      Result Value Range   WBC 12.4 (*) 4.0 - 10.5 K/uL   RBC 4.39  3.87 - 5.11 MIL/uL   Hemoglobin 14.2  12.0 - 15.0 g/dL   HCT 40.9  81.1 - 91.4 %   MCV 92.0  78.0 - 100.0 fL   MCH 32.3  26.0 - 34.0 pg   MCHC 35.1  30.0 - 36.0 g/dL   RDW 78.2  95.6 - 21.3 %   Platelets 277  150 - 400 K/uL   Neutrophils Relative % 71  43 - 77 %   Neutro Abs 8.9 (*) 1.7 - 7.7 K/uL   Lymphocytes Relative 18  12 - 46 %   Lymphs Abs 2.3  0.7 - 4.0 K/uL   Monocytes Relative 9  3 - 12 %   Monocytes Absolute 1.1 (*) 0.1 - 1.0 K/uL   Eosinophils Relative 1  0 - 5 %   Eosinophils Absolute 0.2  0.0 - 0.7 K/uL  Basophils Relative 1  0 - 1 %   Basophils Absolute 0.1  0.0 - 0.1 K/uL  COMPREHENSIVE METABOLIC PANEL     Status: Abnormal   Collection Time    10/06/13  4:06 PM      Result Value Range   Sodium 139  135 - 145 mEq/L   Potassium 3.9  3.5 - 5.1 mEq/L   Chloride 103  96 - 112 mEq/L   CO2 20  19 - 32 mEq/L   Glucose, Bld 94  70 - 99 mg/dL   BUN 21  6 - 23 mg/dL   Creatinine, Ser 4.54  0.50 - 1.10 mg/dL   Calcium 9.8  8.4 - 09.8 mg/dL   Total Protein 8.0  6.0 - 8.3 g/dL   Albumin 3.8  3.5 - 5.2 g/dL   AST 20  0 - 37 U/L   ALT 12  0 - 35 U/L   Alkaline Phosphatase 66  39 - 117 U/L   Total Bilirubin 0.8  0.3 - 1.2 mg/dL   GFR calc non Af Amer 51 (*) >90 mL/min   GFR calc Af Amer 59 (*) >90 mL/min   Comment: (NOTE)     The eGFR has been calculated using the CKD EPI equation.     This calculation has not been validated in all clinical situations.     eGFR's persistently <90 mL/min signify possible Chronic Kidney     Disease.  TROPONIN I     Status: None   Collection Time    10/06/13  4:06 PM      Result Value Range   Troponin I <0.30   <0.30 ng/mL   Comment:            Due to the release kinetics of cTnI,     a negative result within the first hours     of the onset of symptoms does not rule out     myocardial infarction with certainty.     If myocardial infarction is still suspected,     repeat the test at appropriate intervals.    Dg Chest 1 View  10/06/2013   CLINICAL DATA:  Pain post trauma  EXAM: CHEST - 1 VIEW  COMPARISON:  November 25, 2011  FINDINGS: There is no edema or consolidation. Heart is mildly enlarged with normal pulmonary vascularity. No pneumothorax. No adenopathy. There is atherosclerotic change in aorta. No apparent bone lesions.  IMPRESSION: Mild cardiac enlargement. No edema or consolidation. No pneumothorax.   Electronically Signed   By: Bretta Bang M.D.   On: 10/06/2013 14:31   Dg Lumbar Spine Complete  10/06/2013   CLINICAL DATA:  Pain post trauma  EXAM: LUMBAR SPINE - COMPLETE 4+ VIEW  COMPARISON:  None.  FINDINGS: Frontal, lateral, spot lumbosacral lateral, and bilateral oblique views were obtained. There are 5 non-rib-bearing lumbar type vertebral bodies. There is dextroscoliosis. There is slight anterior wedging of the T12 vertebral body, age uncertain. There is no apparent lumbar fracture or spondylolisthesis. There is moderate disc space narrowing at L2-3, L4-5, and L5-S1. There is facet osteoarthritic change at L4-5 and L5-S1 bilaterally. There is a filter in the inferior vena cava.  IMPRESSION: Scoliosis and osteoarthritic change. Mild age uncertain anterior wedging of the T12 vertebral body. No lumbar fracture. No spondylolisthesis. Filter in the inferior vena cava with the apex of the filter at the level of L2.   Electronically Signed   By: Bretta Bang M.D.   On: 10/06/2013 14:25  Dg Pelvis 1-2 Views  10/06/2013   CLINICAL DATA:  Fall  EXAM: PELVIS - 1-2 VIEW  COMPARISON:  None.  FINDINGS: No acute fracture.  No dislocation.  No osteopenia.  IMPRESSION: No acute bony pathology.    Electronically Signed   By: Maryclare Bean M.D.   On: 10/06/2013 11:34   Ct Head Wo Contrast  10/06/2013   CLINICAL DATA:  Fall  EXAM: CT HEAD WITHOUT CONTRAST  CT CERVICAL SPINE WITHOUT CONTRAST  TECHNIQUE: Multidetector CT imaging of the head and cervical spine was performed following the standard protocol without intravenous contrast. Multiplanar CT image reconstructions of the cervical spine were also generated.  COMPARISON:  None.  FINDINGS: CT HEAD FINDINGS  There is diffuse low attenuation within the subcortical and periventricular white matter consistent with small vessel ischemic disease and brain atrophy. Prominence of the sulci and ventricles noted compatible with brain atrophy. No midline shift, ventriculomegaly, mass effect, evidence of mass lesion, intracranial hemorrhage or evidence of cortically based acute infarction. Gray-white matter differentiation is within normal limits throughout the brain. The paranasal sinuses are clear. The mastoid air cells are clear. The skull appears intact.  CT CERVICAL SPINE FINDINGS  The bones are diffusely osteopenic. Normal alignment of the cervical spine. The vertebral body heights are well preserved. Multi level disc space narrowing and ventral endplate spurring is identified consistent with degenerative disc disease. No fractures or subluxations identified.  IMPRESSION: CT head:  1. No acute findings. 2. Small vessel ischemic disease and brain atrophy. CT cervical spine:  1. No acute findings. 2. Cervical spondylosis.   Electronically Signed   By: Signa Kell M.D.   On: 10/06/2013 12:49   Ct Chest Wo Contrast  10/06/2013   CLINICAL DATA:  Left shoulder pain  EXAM: CT CHEST WITHOUT CONTRAST  TECHNIQUE: Multidetector CT imaging of the chest was performed following the standard protocol without IV contrast.  COMPARISON:  08/30/2007  FINDINGS: No pleural effusions identified. There is bilateral, basilar predominant interstitial reticulation, bronchiectasis and  mild subpleural honeycombing identified. This appears slightly progressive when compared with the previous exam. No airspace consolidation identified. No pulmonary parenchymal nodule or mass identified.  Trachea appears patent and is midline. The heart size is moderately enlarged. Calcified atherosclerotic disease involves the thoracic aorta. Calcifications also involve the LAD and left circumflex coronary artery as well as the mitral valve. Calcified sub- carinal lymph node is noted. No mediastinal or hilar adenopathy identified. The patient has a moderate size hiatal hernia.  No axillary or supraclavicular adenopathy identified. Nodule arising from the inferior pole of the left lobe of thyroid gland measures 1.2 cm, image 19/series 3. Incidental imaging through the upper abdomen shows a cyst within the left hepatic lobe. No acute findings identified within the upper abdomen. Gallstones are identified.  Review of the visualized osseous structures shows an acute fracture involving the distal aspect of the left clavicle, image 8/series 3. There is multilevel spondylosis noted throughout the cervical and thoracic spine.  IMPRESSION: 1. Acute fracture involves the distal aspect of the left clavicle. 2. Chronic interstitial lung disease compatible with usual interstitial pneumonitis. 3. Coronary artery calcified atherosclerotic disease. 4. Hiatal hernia.   Electronically Signed   By: Signa Kell M.D.   On: 10/06/2013 14:38   Ct Cervical Spine Wo Contrast  10/06/2013   CLINICAL DATA:  Fall  EXAM: CT HEAD WITHOUT CONTRAST  CT CERVICAL SPINE WITHOUT CONTRAST  TECHNIQUE: Multidetector CT imaging of the head and cervical spine was  performed following the standard protocol without intravenous contrast. Multiplanar CT image reconstructions of the cervical spine were also generated.  COMPARISON:  None.  FINDINGS: CT HEAD FINDINGS  There is diffuse low attenuation within the subcortical and periventricular white matter  consistent with small vessel ischemic disease and brain atrophy. Prominence of the sulci and ventricles noted compatible with brain atrophy. No midline shift, ventriculomegaly, mass effect, evidence of mass lesion, intracranial hemorrhage or evidence of cortically based acute infarction. Gray-white matter differentiation is within normal limits throughout the brain. The paranasal sinuses are clear. The mastoid air cells are clear. The skull appears intact.  CT CERVICAL SPINE FINDINGS  The bones are diffusely osteopenic. Normal alignment of the cervical spine. The vertebral body heights are well preserved. Multi level disc space narrowing and ventral endplate spurring is identified consistent with degenerative disc disease. No fractures or subluxations identified.  IMPRESSION: CT head:  1. No acute findings. 2. Small vessel ischemic disease and brain atrophy. CT cervical spine:  1. No acute findings. 2. Cervical spondylosis.   Electronically Signed   By: Signa Kell M.D.   On: 10/06/2013 12:49   Ct Pelvis Wo Contrast  10/06/2013   CLINICAL DATA:  A fall, patient found on the floor be site her dead. Pelvic pain.  EXAM: CT PELVIS WITHOUT CONTRAST  TECHNIQUE: Multidetector CT imaging of the pelvis was performed following the standard protocol without intravenous contrast.  COMPARISON:  No prior CT.  AP pelvis x-ray earlier same date.  FINDINGS: No evidence of acute fracture involving the bony pelvis or either proximal femur. Mild generalized osseous demineralization. Degenerative changes in both hips, with a large subchondral cyst/geode in the anterior column of the right acetabulum. Sacroiliac joints and symphysis pubis intact with mild degenerative changes. Facet degenerative changes at L4-5 and L5-S1, with relatively well preserved disc spaces at these levels.  Soft tissue window images demonstrate severe aortoiliac atherosclerosis without aneurysm, a small periumbilical hernia containing fat, and sigmoid  colon diverticulosis without evidence of acute diverticulitis.  IMPRESSION: 1. No evidence of acute fracture involving the bony pelvis or either proximal femur. 2. Degenerative changes involving the hip joints bilaterally, right greater than left. 3. Findings in the anatomic pelvis as detailed above, none of which are acute.   Electronically Signed   By: Hulan Saas M.D.   On: 10/06/2013 17:24   Ct Shoulder Left Wo Contrast  10/06/2013   CLINICAL DATA:  Left shoulder pain, fall  EXAM: CT OF THE LEFT SHOULDER WITHOUT CONTRAST  TECHNIQUE: Multidetector CT imaging was performed according to the standard protocol. Multiplanar CT image reconstructions were also generated.  COMPARISON:  10/06/2013  FINDINGS: Minimally displaced left distal clavicle fracture noted by CT. Bones are osteopenic. No associated AC joint separation. Mild degenerative changes of the left AC joint. Glenohumeral joint aligned. Scapula and the visualized humerus appear intact. Visualized left ribs demonstrate no displaced fracture. Left clavicle region soft tissue swelling above the fracture noted.  IMPRESSION: Acute minimally displaced left distal clavicle fracture   Electronically Signed   By: Ruel Favors M.D.   On: 10/06/2013 14:31   Dg Shoulder Left  10/06/2013   ADDENDUM REPORT: 10/06/2013 14:32  ADDENDUM: By CT comparison, there is a left distal clavicle minimally displaced acute fracture evident. No associated AC joint separation.   Electronically Signed   By: Ruel Favors M.D.   On: 10/06/2013 14:32   10/06/2013   CLINICAL DATA:  Fall, pain  EXAM: LEFT SHOULDER - 2+  VIEW  COMPARISON:  None.  FINDINGS: Limited exam because of positioning. No gross malalignment or fracture. AC joint degenerative change present. Bones are osteopenic. Visualized left ribs intact. Thoracic aortic atherosclerosis present.  IMPRESSION: No acute osseous finding.  Electronically Signed: By: Ruel Favors M.D. On: 10/06/2013 11:35   Dg Humerus  Left  10/06/2013   CLINICAL DATA:  Fall, shoulder pain  EXAM: LEFT HUMERUS - 2+ VIEW  COMPARISON:  10/06/2013  FINDINGS: Left humerus intact. Normal alignment. Left clavicle region soft tissue swelling. Lucency of the distal clavicle noted suspicious for a subtle fracture. No associated AC joint separation.  IMPRESSION: Intact left humerus.  Suspect left distal clavicle nondisplaced fracture with overlying soft tissue swelling   Electronically Signed   By: Ruel Favors M.D.   On: 10/06/2013 14:27    Review of Systems  Constitutional: Negative for fever, chills, weight loss, malaise/fatigue and diaphoresis.  HENT: Negative.   Eyes: Negative.   Respiratory: Negative.   Cardiovascular: Negative.   Gastrointestinal: Negative.   Genitourinary: Negative.   Musculoskeletal: Positive for falls and joint pain. Negative for back pain, myalgias and neck pain.       Left shoulder pain  Skin: Negative.   Neurological: Positive for weakness.  Endo/Heme/Allergies: Negative.   Psychiatric/Behavioral: Negative.    Blood pressure 139/73, pulse 81, temperature 97.7 F (36.5 C), temperature source Oral, resp. rate 16, height 5\' 7"  (1.702 m), weight 70.171 kg (154 lb 11.2 oz), SpO2 96.00%. Physical Exam  Constitutional: She appears well-developed. No distress.  HENT:  Head: Normocephalic and atraumatic.  Eyes: Conjunctivae and EOM are normal.  Neck: Normal range of motion.  Cardiovascular: Normal rate.   Respiratory: Effort normal.  Musculoskeletal:       Right shoulder: Normal.       Left shoulder: She exhibits decreased range of motion, tenderness, pain and decreased strength. She exhibits no swelling.       Right elbow: Normal.      Left elbow: Normal.       Right wrist: Normal.       Left wrist: Normal.  She is able to flex and extend the wrist without difficulty. Grip strength 5/5. No tenderness to palpation over the left elbow. She is tender to palpation over the [facility] joint. No significant  bruising noted.   Neurological: She is alert. No sensory deficit.  Skin: No rash noted. She is not diaphoretic. No erythema.    Assessment/Plan: Left shoulder pain due to minimally displaced distal clavicle fracture She has a distal clavicle fracture. We will treat this conservatively with application of the sling. Due to her demented state and pain secondary to her fall, we, as well as the family, feel that she needs placement in a SNF for care. She is unable to perform ADLs at home with the help of only one aide. She required a 2+ assist during transfer from the bed to chair in the hospital room. OT has been consulted and they subsequently placed her in the sling and performed some ROM with the wrist and elbow. Follow up in 4 weeks with Dr. Charlann Boxer in office at [facility].   Earle Troiano LAUREN 10/07/2013, 12:40 PM

## 2013-10-07 NOTE — Discharge Planning (Signed)
Report called to Alice Wu at [facility] 

## 2013-10-07 NOTE — Evaluation (Signed)
Physical Therapy Evaluation Patient Details Name: Alice Wu MRN: [id number] DOB: 1921/09/05 Today's Date: 10/07/2013 Time: 4098-1191 PT Time Calculation (min): 30 min  PT Assessment / Plan / Recommendation History of Present Illness  Alice Wu is a 79 y.o. female with history of dementia, depression, peripheral neuropathy, hemorrhagic CVA (2008), DVT status post IVC filter, hypertension, hemorrhoids with intermittent rectal bleeding, chronic headaches, resident of independent living who has 24 hour care, wheelchair mobile at baseline, presented to the ED on 10/06/13 following a mechanical fall and left shoulder pain. Patient unable to provide any history secondary to dense dementia. History is obtained from patient's caregiver who is at the bedside. According to family and caregiver, patient is wheelchair mobile. She requires full assistance from caregiver in addition to her walker to transfer from her bed to the wheelchair. This morning at approximately 9 AM, patient was sitting at the edge of the bed waiting to get onto her wheelchair. The caregiver briefly stepped into the bathroom to get her toothbrush. Within the split moment, patient apparently tried to stand up, lost balance and when the caregiver rushed back into the room, she found the patient fallen on the carpeted floor on her left side. There was no loss of consciousness or bleeding. Patient did not complain of any chest pain or palpitations. She did complain of left-sided shoulder and neck pain. She was brought to the ED. In the ED extensive evaluation revealed left distal clavicular fracture  Clinical Impression  Pt admitted with above. Pt currently with functional limitations due to the deficits listed below (see PT Problem List).  Pt will benefit from skilled PT to increase their independence and safety with mobility to allow discharge to the venue listed below.       PT Assessment  Patient needs continued PT services     Follow Up Recommendations  SNF    Does the patient have the potential to tolerate intense rehabilitation      Barriers to Discharge Other (comment) Needs a higher level of assist than she currently has    Equipment Recommendations  Hospital bed;Other (comment) (Drop-arm BSC)    Recommendations for Other Services     Frequency Min 3X/week    Precautions / Restrictions Precautions Precautions: Fall Precaution Comments: L clavicle fracture.  Required Braces or Orthoses: Sling Restrictions Weight Bearing Restrictions: No Other Position/Activity Restrictions: WBAT L UE   Pertinent Vitals/Pain Pain significantly limiting all aspects of mobility; unable to rate patient repositioned for comfort        Mobility  Bed Mobility Bed Mobility: Supine to Sit;Sitting - Scoot to Edge of Bed Supine to Sit: 1: +2 Total assist Supine to Sit: Patient Percentage: 30% Sitting - Scoot to Edge of Bed: 2: Max assist (with bed pad) Details for Bed Mobility Assistance: Significantly limited by pain; Required max/tot assist for all mobility/elevating trunk from bed; Got up on R side in hopes of minimizing L shoulder pain Transfers Transfers: Stand Pivot Transfers Stand Pivot Transfers: 2: Max assist Details for Transfer Assistance: Max assist for basic pivot transfer; Requird knee block for initial lift off, but once on feet, knees did not buckle; Bilateral supprot given at gait belt; Pt quite anxious throughout transfer Ambulation/Gait Ambulation/Gait Assistance: Other (comment) (non-ambulatory PTA)    Exercises Other Exercises Other Exercises: X 10 AAROM wrist and digit ROM L hand   PT Diagnosis: Acute pain;Generalized weakness  PT Problem List: Decreased strength;Decreased range of motion;Decreased activity tolerance;Decreased balance;Decreased mobility;Decreased coordination;Decreased cognition;Decreased safety  awareness;Decreased knowledge of precautions;Pain PT Treatment Interventions:  Functional mobility training;Therapeutic activities;Balance training;Therapeutic exercise;Patient/family education     PT Goals(Current goals can be found in the care plan section) Acute Rehab PT Goals Patient Stated Goal: none stated PT Goal Formulation: With family Time For Goal Achievement: 10/21/13 Potential to Achieve Goals: Good  Visit Information  Last PT Received On: 10/07/13 Assistance Needed: +2 PT/OT/SLP Co-Evaluation/Treatment: Yes Reason for Co-Treatment: Complexity of the patient's impairments (multi-system involvement) PT goals addressed during session: Mobility/safety with mobility OT goals addressed during session: ADL's and self-care;Proper use of Adaptive equipment and DME;Strengthening/ROM History of Present Illness: Alice Wu is a 79 y.o. female with history of dementia, depression, peripheral neuropathy, hemorrhagic CVA (2008), DVT status post IVC filter, hypertension, hemorrhoids with intermittent rectal bleeding, chronic headaches, resident of independent living who has 24 hour care, wheelchair mobile at baseline, presented to the ED on 10/06/13 following a mechanical fall and left shoulder pain. Patient unable to provide any history secondary to dense dementia. History is obtained from patient's caregiver who is at the bedside. According to family and caregiver, patient is wheelchair mobile. She requires full assistance from caregiver in addition to her walker to transfer from her bed to the wheelchair. This morning at approximately 9 AM, patient was sitting at the edge of the bed waiting to get onto her wheelchair. The caregiver briefly stepped into the bathroom to get her toothbrush. Within the split moment, patient apparently tried to stand up, lost balance and when the caregiver rushed back into the room, she found the patient fallen on the carpeted floor on her left side. There was no loss of consciousness or bleeding. Patient did not complain of any chest pain or  palpitations. She did complain of left-sided shoulder and neck pain. She was brought to the ED. In the ED extensive evaluation revealed left distal clavicular fracture       Prior Functioning  Home Living Family/patient expects to be discharged to:: Skilled nursing facility Prior Function Level of Independence: Needs assistance Gait / Transfers Assistance Needed: pivot transfers with walker to wheelchair. Only limited distance with walker but mostly wheelchair use ADL's / Homemaking Assistance Needed: pt requires extensive assist with all ADL and functional transfers. She has 24/7 caregivers Communication Communication: No difficulties Dominant Hand: Right    Cognition  Cognition Arousal/Alertness: Awake/alert Behavior During Therapy: WFL for tasks assessed/performed Overall Cognitive Status: History of cognitive impairments - at baseline    Extremity/Trunk Assessment Upper Extremity Assessment Upper Extremity Assessment: Defer to OT evaluation LUE: Unable to fully assess due to pain;Unable to fully assess due to immobilization Lower Extremity Assessment Lower Extremity Assessment: Generalized weakness (did not fully assess, pt extremely distracted by pain, and unable to follow commands)   Balance Balance Balance Assessed: Yes Static Sitting Balance Static Sitting - Balance Support: Right upper extremity supported Static Sitting - Level of Assistance: 3: Mod assist;2: Max assist  End of Session PT - End of Session Equipment Utilized During Treatment: Gait belt (sling) Activity Tolerance: Patient limited by pain (limited by anxiety as well) Patient left: in chair;with family/visitor present;with call bell/phone within reach Nurse Communication: Mobility status  GP Functional Assessment Tool Used: Clinical Judgement Functional Limitation: Mobility: Walking and moving around Mobility: Walking and Moving Around Current Status ([id number]): At least 80 percent but less than 100 percent  impaired, limited or restricted Mobility: Walking and Moving Around Goal Status [phone]): At least 1 percent but less than 20 percent impaired, limited or restricted  Van Clines Leupp, [place] [phone]  10/07/2013, 12:33 PM

## 2013-11-07 ENCOUNTER — Ambulatory Visit (INDEPENDENT_AMBULATORY_CARE_PROVIDER_SITE_OTHER): Payer: Medicare Other | Admitting: Podiatry

## 2013-11-07 ENCOUNTER — Encounter: Payer: Self-pay | Admitting: Podiatry

## 2013-11-07 VITALS — BP 101/65 | HR 54 | Resp 14

## 2013-11-07 DIAGNOSIS — M79609 Pain in unspecified limb: Secondary | ICD-10-CM

## 2013-11-07 DIAGNOSIS — B351 Tinea unguium: Secondary | ICD-10-CM

## 2013-11-07 NOTE — Patient Instructions (Signed)
`   Return as needed for debridement of mycotic hallux toenails.

## 2013-11-07 NOTE — Progress Notes (Signed)
   Subjective:    Patient ID: Chonte W Rattigan, female    DOB: May 05, 1921, 80 y.o.   MRN: [id number]  HPI The CNA or Nurse trimmed on big toenail on the right and has been a week ago and the son states that she has neuropathy and not sure if there is any draining    Review of Systems  Constitutional: Negative.   HENT: Positive for hearing loss.   Eyes: Negative.   Respiratory: Negative.   Cardiovascular: Negative.   Gastrointestinal: Negative.   Endocrine: Negative.   Genitourinary: Negative.   Musculoskeletal:       Difficulty walking  Skin: Negative.   Allergic/Immunologic: Negative.   Neurological: Negative.   Hematological: Negative.   Psychiatric/Behavioral: Negative.        Objective:   Physical Exam  A confused 80 year old white female presents with her son. She is seated in a wheelchair and her son does not want her transferred to the treatment chair.   Vascular: The DP is are 2/4 bilaterally. The left PT is 0/4 and the right PT is 2/4.  Dermatological: The right and left hallux toenails are incurvated with mild discolored and trophic changes noted. There is no erythema, edema or drainage from the hallux nails bilaterally.  Musculoskeletal: Hammering of lateral digits noted bilaterally.  Neurological: Deferred      Assessment & Plan:   Assessment: Mycotic hallux nails without any clinical sign of bacterial infection bilaterally  Plan: The mycotic hallux nails are debrided today without any bleeding. Reappoint as needed at patient's or son's request.

## 2013-11-08 ENCOUNTER — Encounter: Payer: Self-pay | Admitting: Podiatry

## 2014-10-22 ENCOUNTER — Emergency Department (HOSPITAL_COMMUNITY)
Admission: EM | Admit: 2014-10-22 | Discharge: 2014-10-22 | Disposition: A | Discharge status: Home / Self Care | Payer: Medicare Other | Attending: Emergency Medicine | Admitting: Emergency Medicine

## 2014-10-22 ENCOUNTER — Emergency Department (HOSPITAL_COMMUNITY): Payer: Medicare Other

## 2014-10-22 ENCOUNTER — Encounter (HOSPITAL_COMMUNITY): Payer: Self-pay | Admitting: Emergency Medicine

## 2014-10-22 DIAGNOSIS — H9190 Unspecified hearing loss, unspecified ear: Secondary | ICD-10-CM | POA: Diagnosis not present

## 2014-10-22 DIAGNOSIS — Z8719 Personal history of other diseases of the digestive system: Secondary | ICD-10-CM | POA: Diagnosis not present

## 2014-10-22 DIAGNOSIS — Z86718 Personal history of other venous thrombosis and embolism: Secondary | ICD-10-CM | POA: Diagnosis not present

## 2014-10-22 DIAGNOSIS — G629 Polyneuropathy, unspecified: Secondary | ICD-10-CM | POA: Diagnosis not present

## 2014-10-22 DIAGNOSIS — E785 Hyperlipidemia, unspecified: Secondary | ICD-10-CM | POA: Insufficient documentation

## 2014-10-22 DIAGNOSIS — R079 Chest pain, unspecified: Secondary | ICD-10-CM | POA: Diagnosis present

## 2014-10-22 DIAGNOSIS — F0390 Unspecified dementia without behavioral disturbance: Secondary | ICD-10-CM | POA: Insufficient documentation

## 2014-10-22 DIAGNOSIS — F329 Major depressive disorder, single episode, unspecified: Secondary | ICD-10-CM | POA: Diagnosis not present

## 2014-10-22 DIAGNOSIS — Z79899 Other long term (current) drug therapy: Secondary | ICD-10-CM | POA: Diagnosis not present

## 2014-10-22 DIAGNOSIS — Z8673 Personal history of transient ischemic attack (TIA), and cerebral infarction without residual deficits: Secondary | ICD-10-CM | POA: Diagnosis not present

## 2014-10-22 DIAGNOSIS — I10 Essential (primary) hypertension: Secondary | ICD-10-CM | POA: Insufficient documentation

## 2014-10-22 DIAGNOSIS — R52 Pain, unspecified: Secondary | ICD-10-CM

## 2014-10-22 DIAGNOSIS — M1990 Unspecified osteoarthritis, unspecified site: Secondary | ICD-10-CM | POA: Insufficient documentation

## 2014-10-22 MED ORDER — ENSURE COMPLETE PO LIQD
237.0000 mL | Freq: Two times a day (BID) | ORAL | Status: DC
  Filled 2014-10-22: qty 237

## 2014-10-22 NOTE — ED Notes (Signed)
[facility] contacted to transport patient back to facility.  Informed it may be a while before they are available to pick her up.

## 2014-10-22 NOTE — ED Notes (Signed)
Per EMS, pt coming [facility] for right side CP and left upper abd pain. Pt does not speak often at baseline and usually just moans and groans. NAD at this time. Pt at baseline.

## 2014-10-22 NOTE — Discharge Instructions (Signed)
It was our pleasure to provide your ER care today - we hope that you feel better.  Follow up with primary care doctor during coming week.  Return to ER if worse, new symptoms, fevers, severe pain, persistent vomiting, trouble breathing, other concern.

## 2014-10-22 NOTE — ED Provider Notes (Signed)
CSN: [id number]     Arrival date & time 10/22/14  1115 History   First MD Initiated Contact with Patient 10/22/14 1115     Chief Complaint  Patient presents with  . Dementia     (Consider location/radiation/quality/duration/timing/severity/associated sxs/prior Treatment) The history is provided by the patient and the EMS personnel. The history is limited by the condition of the patient.  per ems report, pt from ecf, had c/o pain today. Pt w hx advanced dementia, at baseline chair/wheelchair bound, confused/not conversant - level 5 caveat.  No report of trauma or fall. No fevers reported. pts mental status described as being consistent w baseline.  Per report pt c/o pain, but unable to verbalize/localize.       Past Medical History  Diagnosis Date  . Depression   . Presbyacusia   . Peripheral neuropathy   . Hyperlipidemia   . Hearing loss   . Stroke   . 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 DVT of leg (deep venous thrombosis) 2008    ?left  . Idiopathic neuropathy   . Hypertension     "related to h/o cerebral hemorrhage 2008; dr put her on meds since that hx"  . Presence of IVC filter 2008  . Hemorrhoids, internal, with bleeding   . Cluster headaches     "during middle age"  . Chronic headaches     daily since CVA 2008  . Dementia   . Allergy   . Blood transfusion without reported diagnosis   . Arthritis    Past Surgical History  Procedure Laterality Date  . Vena cava filter placement  2008    "for DVT"  . Tonsillectomy and adenoidectomy      "when I was a kid"   Family History  Problem Relation Age of Onset  . Diabetes Mother    History  Substance Use Topics  . Smoking status: Never Smoker   . Smokeless tobacco: Never Used  . Alcohol Use: No   OB History    No data available       Review of Systems  Unable to perform ROS: Dementia  Constitutional: Negative for fever.  level 5 caveat,  advanced dementia, pt unable to perform ros.     Allergies  Review of patient's allergies indicates no known allergies.  Home Medications   Prior to Admission medications   Medication Sig Start Date End Date Taking? Authorizing Provider  acetaminophen (TYLENOL) 500 MG tablet Take 500 mg by mouth every 6 (six) hours as needed for mild pain or fever.    Historical Provider, MD  alendronate (FOSAMAX) 70 MG/75ML solution Take 37.5 mg by mouth every 7 (seven) days.  07/30/12   Historical Provider, MD  calcium-vitamin D (OSCAL WITH D) 500-200 MG-UNIT per tablet Take 1 tablet by mouth daily.    Historical Provider, MD  citalopram (CELEXA) 20 MG tablet Take 20 mg by mouth daily.    Historical Provider, MD  Cranberry-Vitamin C-Vitamin E (CRANBERRY PLUS VITAMIN C PO) Take 1 tablet by mouth daily.    Historical Provider, MD  diltiazem (CARDIZEM CD) 120 MG 24 hr capsule  07/30/12   Historical Provider, MD  docusate sodium (COLACE) 100 MG capsule Take 100 mg by mouth daily.    Historical Provider, MD  HYDROcodone-acetaminophen (NORCO/VICODIN) 5-325 MG per tablet Take 2 tablets by mouth every 4 (four) hours as needed. 10/06/13   Glynn Octave, MD  hydrocortisone (ANUSOL-HC) 2.5 %  rectal cream Place rectally 4 (four) times daily as needed for hemorrhoids or itching. 10/07/13   Belkys A Regalado, MD  Multiple Vitamins-Minerals (ICAPS AREDS FORMULA PO) Take 1 capsule by mouth 2 (two) times daily.    Historical Provider, MD  Multiple Vitamins-Minerals (MULTIVITAMIN WITH MINERALS) tablet Take 1 tablet by mouth daily.    Historical Provider, MD  SF 5000 PLUS 1.1 % CREA dental cream Place 1 application onto teeth daily.  10/25/11   Historical Provider, MD   BP 143/65 mmHg  Pulse 57  Temp(Src) 97.5 F (36.4 C) (Oral)  Resp 18  SpO2 100% Physical Exam  Constitutional: She is oriented to person, place, and time. She appears well-developed and well-nourished. No distress.  HENT:  Mouth/Throat: Oropharynx is  clear and moist.  Eyes: Conjunctivae are normal. Pupils are equal, round, and reactive to light. No scleral icterus.  Neck: Neck supple. No tracheal deviation present.  No stiffness or rigidity  Cardiovascular: Normal rate, regular rhythm, normal heart sounds and intact distal pulses.   Pulmonary/Chest: Effort normal and breath sounds normal. No respiratory distress. She exhibits tenderness.  Abdominal: Soft. Normal appearance and bowel sounds are normal. She exhibits no distension and no mass. There is no tenderness. There is no rebound and no guarding.  Genitourinary:  No cva tenderness  Musculoskeletal: She exhibits no edema or tenderness.  Neurological: She is alert and oriented to person, place, and time. No cranial nerve deficit.  Moves bil ext purposefully. Does not follow commands.   Skin: Skin is warm and dry. No rash noted. She is not diaphoretic.  Psychiatric:  Alert, content appearing in general, periods of moaning or calling out.   Nursing note and vitals reviewed.   ED Course  Procedures (including critical care time) Labs Review  Results for orders placed or performed during the hospital encounter of 10/06/13  CBC with Differential  Result Value Ref Range   WBC 12.4 (H) 4.0 - 10.5 K/uL   RBC 4.39 3.87 - 5.11 MIL/uL   Hemoglobin 14.2 12.0 - 15.0 g/dL   HCT 16.1 09.6 - 04.5 %   MCV 92.0 78.0 - 100.0 fL   MCH 32.3 26.0 - 34.0 pg   MCHC 35.1 30.0 - 36.0 g/dL   RDW 40.9 81.1 - 91.4 %   Platelets 277 150 - 400 K/uL   Neutrophils Relative % 71 43 - 77 %   Neutro Abs 8.9 (H) 1.7 - 7.7 K/uL   Lymphocytes Relative 18 12 - 46 %   Lymphs Abs 2.3 0.7 - 4.0 K/uL   Monocytes Relative 9 3 - 12 %   Monocytes Absolute 1.1 (H) 0.1 - 1.0 K/uL   Eosinophils Relative 1 0 - 5 %   Eosinophils Absolute 0.2 0.0 - 0.7 K/uL   Basophils Relative 1 0 - 1 %   Basophils Absolute 0.1 0.0 - 0.1 K/uL  Comprehensive metabolic panel  Result Value Ref Range   Sodium 139 135 - 145 mEq/L    Potassium 3.9 3.5 - 5.1 mEq/L   Chloride 103 96 - 112 mEq/L   CO2 20 19 - 32 mEq/L   Glucose, Bld 94 70 - 99 mg/dL   BUN 21 6 - 23 mg/dL   Creatinine, Ser 7.82 0.50 - 1.10 mg/dL   Calcium 9.8 8.4 - 95.6 mg/dL   Total Protein 8.0 6.0 - 8.3 g/dL   Albumin 3.8 3.5 - 5.2 g/dL   AST 20 0 - 37 U/L   ALT 12 0 -  35 U/L   Alkaline Phosphatase 66 39 - 117 U/L   Total Bilirubin 0.8 0.3 - 1.2 mg/dL   GFR calc non Af Amer 51 (L) >90 mL/min   GFR calc Af Amer 59 (L) >90 mL/min  Troponin I  Result Value Ref Range   Troponin I <0.30 <0.30 ng/mL   Dg Chest 2 View  10/22/2014   CLINICAL DATA:  Right-sided chest pain  EXAM: CHEST  2 VIEW  COMPARISON:  October 06, 2013  FINDINGS: Note that the patient's mandible obscures a portion of the right apex. There is mild elevation of the right hemidiaphragm, stable. There is no edema or consolidation. Heart is borderline enlarged with pulmonary vascularity within normal limits. There is calcification in the mitral annulus. There is atherosclerotic calcification throughout the aorta. No bone lesions. No adenopathy.  IMPRESSION: Mild elevation of the right hemidiaphragm. No edema or consolidation. Borderline cardiac enlargement.   Electronically Signed   By: Bretta BangWilliam  Woodruff M.D.   On: 10/22/2014 14:13       EKG Interpretation   Date/Time:  Sunday October 22 2014 11:15:39 EST Ventricular Rate:  54 PR Interval:  152 QRS Duration: 90 QT Interval:  469 QTC Calculation: 444 R Axis:   -49 Text Interpretation:  Sinus rhythm Left axis deviation No significant  change since last tracing Confirmed by Denton LankSTEINL  MD, Caryn BeeKEVIN ([id number]) on  10/22/2014 11:23:25 AM      MDM   Labs.  Reviewed nursing notes and prior charts for additional history.   Family arrives - they request no lab work be done, state pts mental status c/w baseline.  They and caregiver indicate pt hadnt yet eaten/drank today and they feel she may be hungry.  Meal tray and po fluids ordered.  Family  continues to refuse lab work. Pt content, alert, and mental status remains c/w prior.  Pt ate meal, and indicates no pain.   Will d/c back to [facility]CF per pt and family request.    Suzi RootsKevin E Sherline Eberwein, MD 10/22/14 1420

## 2019-12-12 DEATH — deceased
